# Patient Record
Sex: Female | Born: 1965 | Race: White | Hispanic: No | Marital: Married | Smoking: Never smoker
Health system: Southern US, Community
[De-identification: ages and names within clinical notes are randomized; demographics above are authoritative.]

## PROBLEM LIST (undated history)

## (undated) DIAGNOSIS — K219 Gastro-esophageal reflux disease without esophagitis: Secondary | ICD-10-CM

## (undated) DIAGNOSIS — Z8041 Family history of malignant neoplasm of ovary: Secondary | ICD-10-CM

## (undated) DIAGNOSIS — J3089 Other allergic rhinitis: Secondary | ICD-10-CM

## (undated) DIAGNOSIS — K5900 Constipation, unspecified: Secondary | ICD-10-CM

## (undated) DIAGNOSIS — J45909 Unspecified asthma, uncomplicated: Secondary | ICD-10-CM

## (undated) DIAGNOSIS — K649 Unspecified hemorrhoids: Secondary | ICD-10-CM

## (undated) DIAGNOSIS — Z1371 Encounter for nonprocreative screening for genetic disease carrier status: Secondary | ICD-10-CM

## (undated) DIAGNOSIS — D649 Anemia, unspecified: Secondary | ICD-10-CM

## (undated) DIAGNOSIS — K594 Anal spasm: Secondary | ICD-10-CM

## (undated) DIAGNOSIS — Z803 Family history of malignant neoplasm of breast: Secondary | ICD-10-CM

## (undated) DIAGNOSIS — R110 Nausea: Secondary | ICD-10-CM

## (undated) DIAGNOSIS — Z808 Family history of malignant neoplasm of other organs or systems: Secondary | ICD-10-CM

## (undated) DIAGNOSIS — R1310 Dysphagia, unspecified: Secondary | ICD-10-CM

## (undated) DIAGNOSIS — H409 Unspecified glaucoma: Secondary | ICD-10-CM

## (undated) DIAGNOSIS — R109 Unspecified abdominal pain: Secondary | ICD-10-CM

## (undated) DIAGNOSIS — Z8052 Family history of malignant neoplasm of bladder: Secondary | ICD-10-CM

## (undated) DIAGNOSIS — R197 Diarrhea, unspecified: Secondary | ICD-10-CM

## (undated) HISTORY — DX: Family history of malignant neoplasm of breast: Z80.3

## (undated) HISTORY — PX: NOSE SURGERY: SHX723

## (undated) HISTORY — DX: Family history of malignant neoplasm of ovary: Z80.41

## (undated) HISTORY — DX: Unspecified abdominal pain: R10.9

## (undated) HISTORY — DX: Dysphagia, unspecified: R13.10

## (undated) HISTORY — PX: ABDOMINAL HYSTERECTOMY: SHX81

## (undated) HISTORY — DX: Family history of malignant neoplasm of bladder: Z80.52

## (undated) HISTORY — DX: Unspecified hemorrhoids: K64.9

## (undated) HISTORY — DX: Other allergic rhinitis: J30.89

## (undated) HISTORY — DX: Anal spasm: K59.4

## (undated) HISTORY — PX: TONSILLECTOMY: SUR1361

## (undated) HISTORY — DX: Anemia, unspecified: D64.9

## (undated) HISTORY — DX: Constipation, unspecified: K59.00

## (undated) HISTORY — PX: BREAST LUMPECTOMY: SHX2

## (undated) HISTORY — PX: SHOULDER SURGERY: SHX246

## (undated) HISTORY — PX: BREAST EXCISIONAL BIOPSY: SUR124

## (undated) HISTORY — DX: Unspecified glaucoma: H40.9

## (undated) HISTORY — PX: BACK SURGERY: SHX140

## (undated) HISTORY — DX: Nausea: R11.0

## (undated) HISTORY — PX: APPENDECTOMY: SHX54

## (undated) HISTORY — DX: Family history of malignant neoplasm of other organs or systems: Z80.8

## (undated) HISTORY — PX: WISDOM TOOTH EXTRACTION: SHX21

## (undated) HISTORY — DX: Unspecified asthma, uncomplicated: J45.909

## (undated) HISTORY — DX: Diarrhea, unspecified: R19.7

## (undated) HISTORY — DX: Gastro-esophageal reflux disease without esophagitis: K21.9

---

## 1997-05-12 ENCOUNTER — Inpatient Hospital Stay (HOSPITAL_COMMUNITY): Admission: AD | Admit: 1997-05-12 | Discharge: 1997-05-14 | Payer: Self-pay | Admitting: Obstetrics & Gynecology

## 1997-06-13 ENCOUNTER — Other Ambulatory Visit: Admission: RE | Admit: 1997-06-13 | Discharge: 1997-06-13 | Payer: Self-pay | Admitting: Obstetrics & Gynecology

## 2000-10-01 ENCOUNTER — Ambulatory Visit (HOSPITAL_COMMUNITY): Admission: RE | Admit: 2000-10-01 | Discharge: 2000-10-01 | Payer: Self-pay | Admitting: Orthopedic Surgery

## 2002-07-25 ENCOUNTER — Encounter: Admission: RE | Admit: 2002-07-25 | Discharge: 2002-07-25 | Payer: Self-pay | Admitting: Obstetrics & Gynecology

## 2002-10-14 ENCOUNTER — Inpatient Hospital Stay (HOSPITAL_COMMUNITY): Admission: AD | Admit: 2002-10-14 | Discharge: 2002-10-16 | Payer: Self-pay | Admitting: Obstetrics & Gynecology

## 2002-11-17 ENCOUNTER — Other Ambulatory Visit: Admission: RE | Admit: 2002-11-17 | Discharge: 2002-11-17 | Payer: Self-pay | Admitting: Obstetrics and Gynecology

## 2006-11-17 ENCOUNTER — Ambulatory Visit (HOSPITAL_COMMUNITY): Admission: RE | Admit: 2006-11-17 | Discharge: 2006-11-17 | Payer: Self-pay | Admitting: Obstetrics and Gynecology

## 2006-11-19 ENCOUNTER — Encounter: Admission: RE | Admit: 2006-11-19 | Discharge: 2006-11-19 | Payer: Self-pay | Admitting: Obstetrics and Gynecology

## 2006-12-16 ENCOUNTER — Encounter: Admission: RE | Admit: 2006-12-16 | Discharge: 2006-12-16 | Payer: Self-pay | Admitting: Surgery

## 2006-12-16 ENCOUNTER — Ambulatory Visit (HOSPITAL_BASED_OUTPATIENT_CLINIC_OR_DEPARTMENT_OTHER): Admission: RE | Admit: 2006-12-16 | Discharge: 2006-12-16 | Payer: Self-pay | Admitting: Surgery

## 2006-12-16 ENCOUNTER — Encounter (INDEPENDENT_AMBULATORY_CARE_PROVIDER_SITE_OTHER): Payer: Self-pay | Admitting: Surgery

## 2008-05-07 IMAGING — MG MM BREAST WIRE LOCALIZATION*R*
3 series · 3 of 3 positions shown · non-contrast
Comparison: none

MAMMO BREAST SURGICAL SPECIMEN

BREAST WIRE LOCALIZATION *R*
RIGHT BREAST WIRE LOCALIZATION AND SURGICAL SPECIMEN RADIOGRAPH:
CLINICAL DATA: 41-year-old female with right breast mass for needle localization.

[R LM]
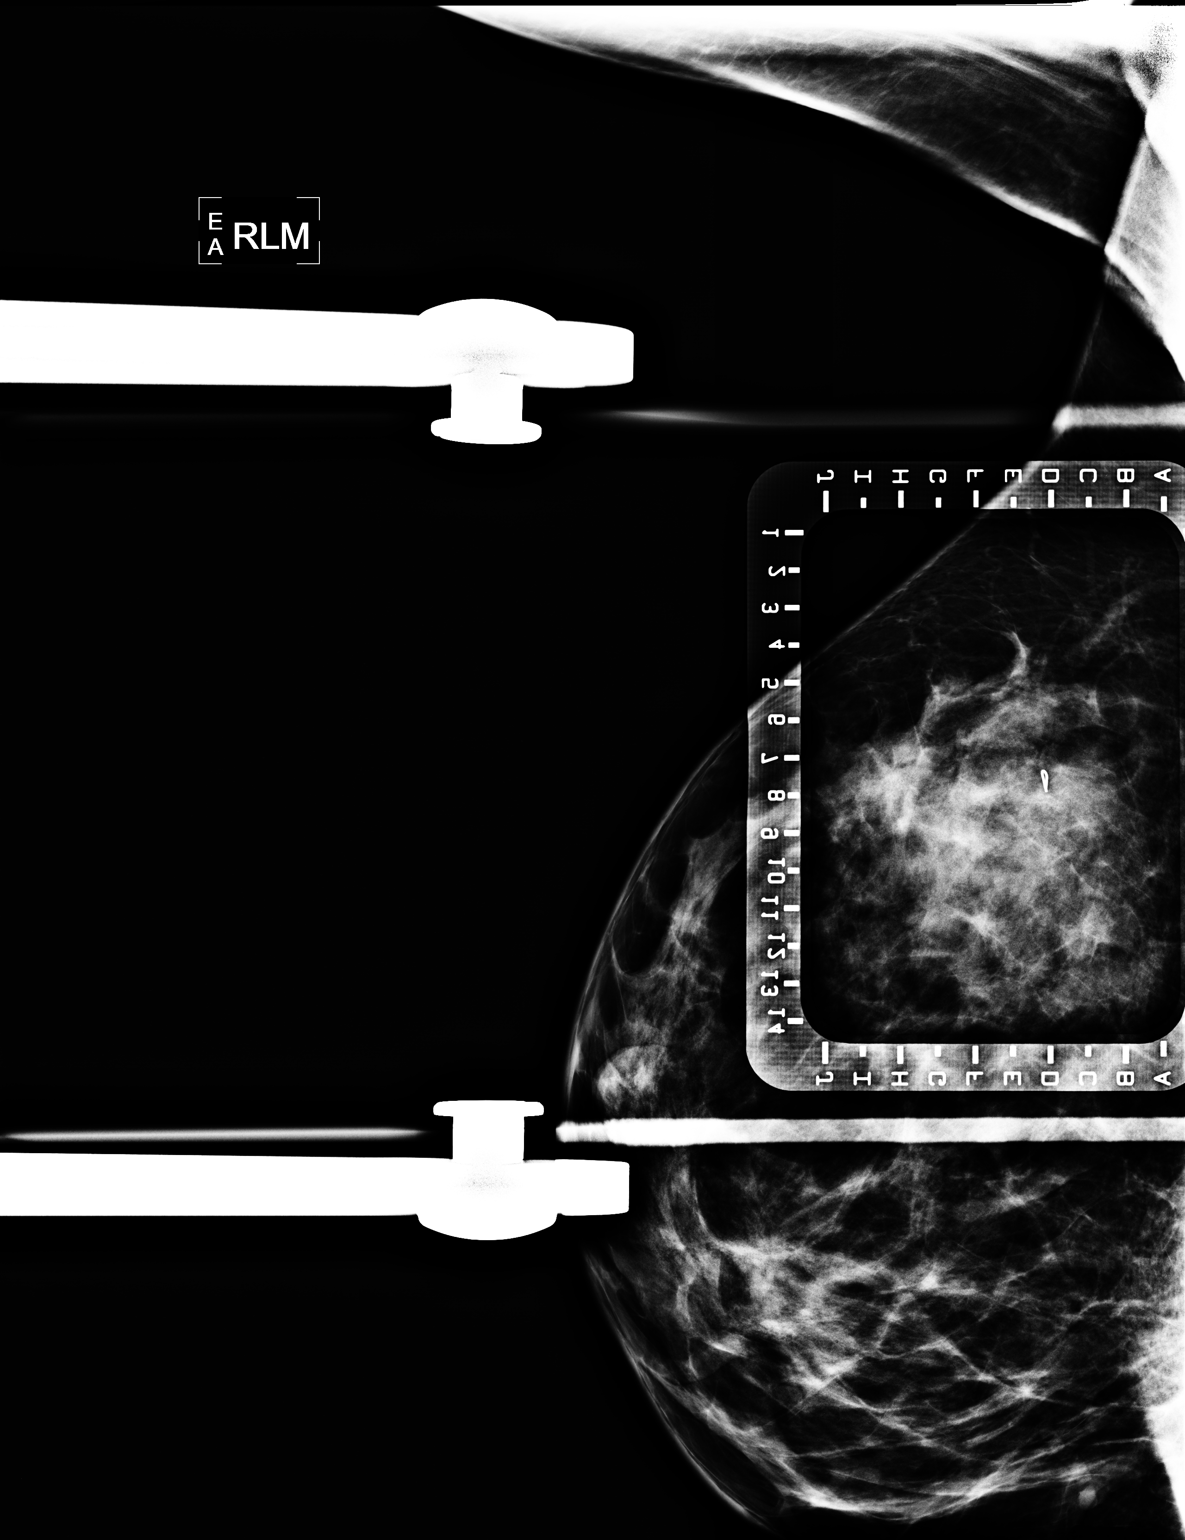

[R CC (1 of 2)]
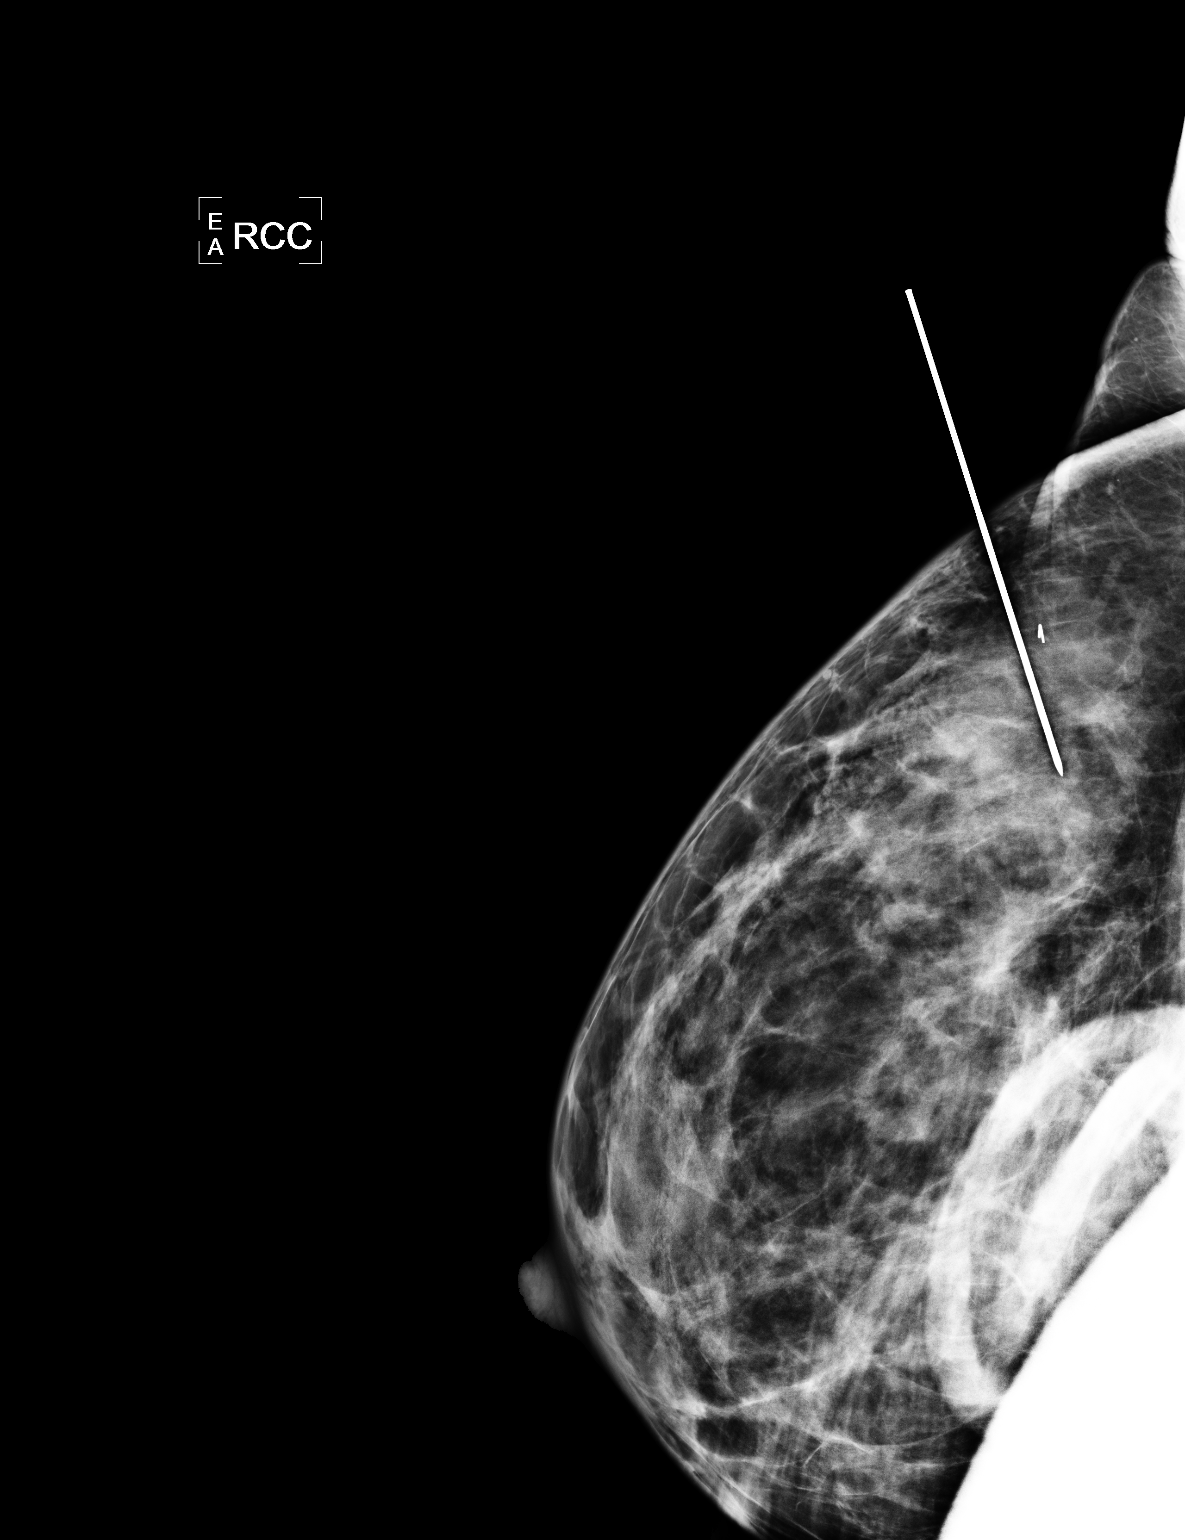

[R CC (2 of 2)]
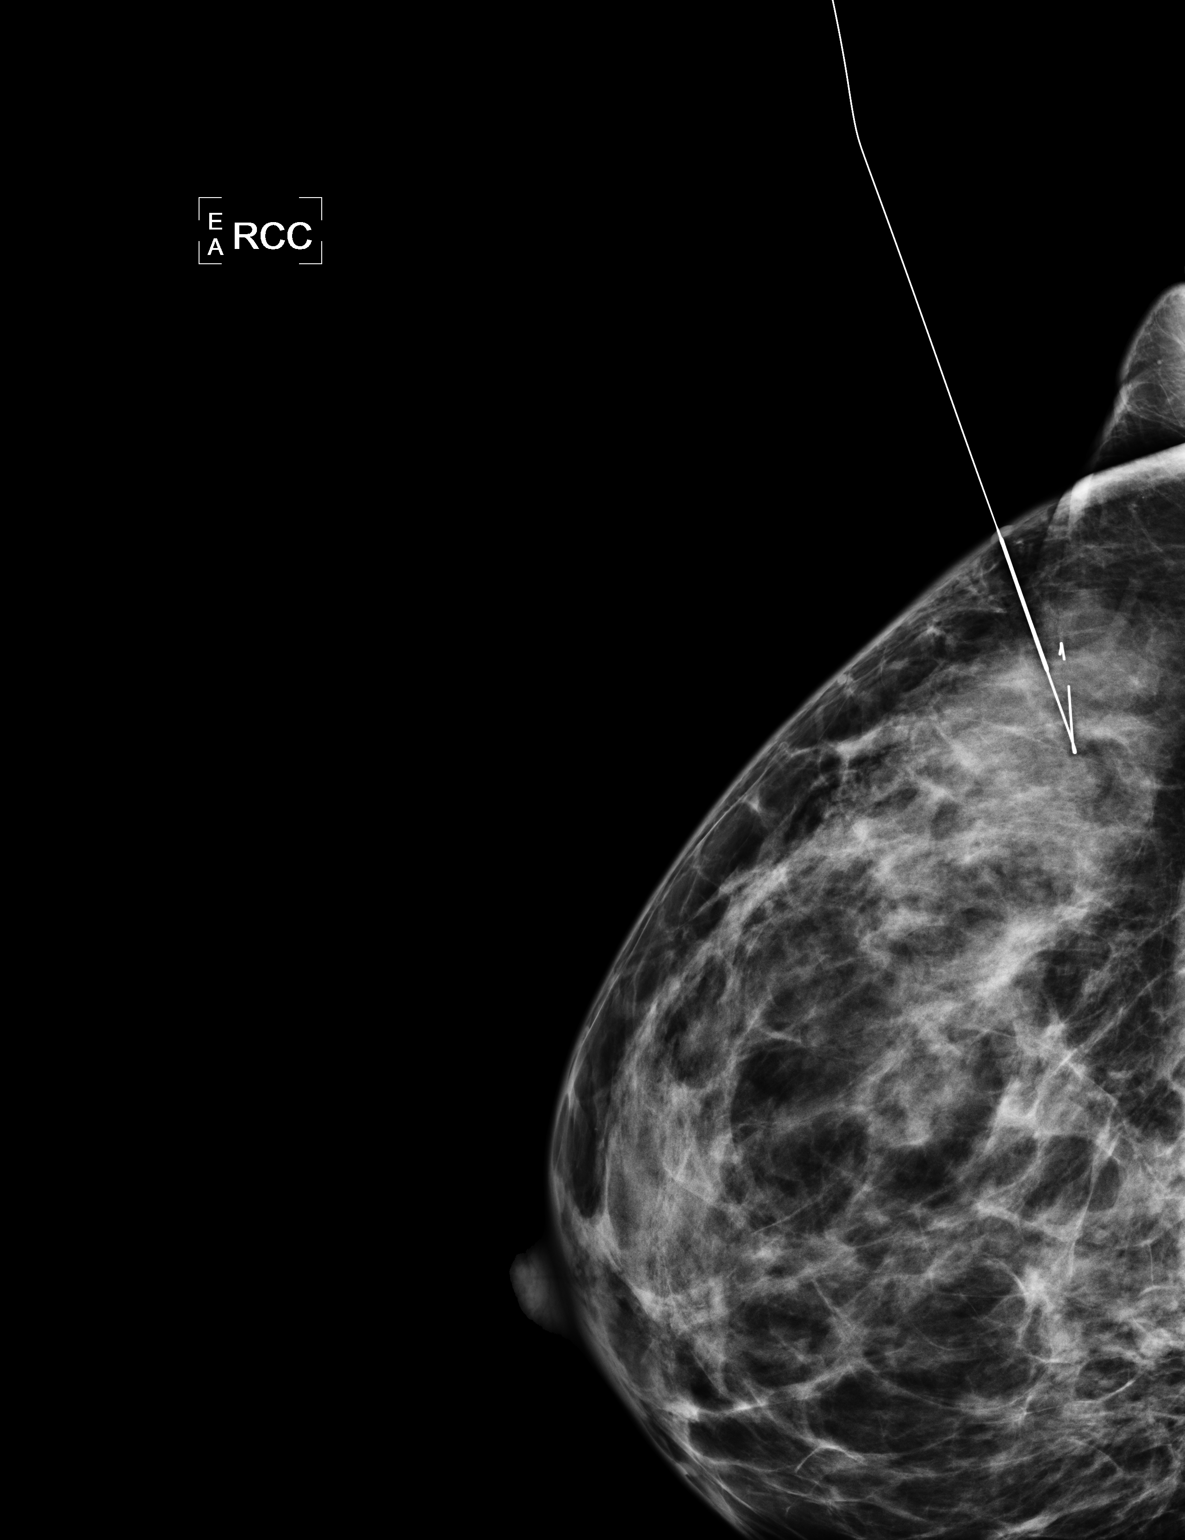

[3 of 3 positions shown; findings below may reference images not displayed]

Written informed consent was obtained and the patient understood the procedure, risks, benefits, 
alternatives, had no questions and agreed to proceed with the procedure.

The patient's lateral right breast was prepped with Betadine and alcohol and anesthetized with 1% 
Lidocaine.  The biopsy clip was targeted. Using a Kopans needle/wire, a lateral approach was 
utilized and wire tip placed 1.5 cm past the biopsy clip.  The clip lies 2 cm from the skin 
surface. The wire was stabilized to the skin and the patient sent to surgery. The enitire procedure
was performed with sterile technique.

Surgical specimen was imaged and demonstrates both the entire wire and clip within the specimen.
IMPRESSION: Right breast wire localization and surgical specimen, as described.

,

## 2008-10-26 ENCOUNTER — Encounter: Admission: RE | Admit: 2008-10-26 | Discharge: 2008-10-26 | Payer: Self-pay | Admitting: Internal Medicine

## 2009-10-30 ENCOUNTER — Encounter: Admission: RE | Admit: 2009-10-30 | Discharge: 2009-10-30 | Payer: Self-pay | Admitting: Orthopedic Surgery

## 2010-05-26 ENCOUNTER — Other Ambulatory Visit: Payer: Self-pay | Admitting: Specialist

## 2010-05-26 ENCOUNTER — Ambulatory Visit (HOSPITAL_COMMUNITY)
Admission: RE | Admit: 2010-05-26 | Discharge: 2010-05-26 | Disposition: A | Discharge status: Home / Self Care | Payer: BC Managed Care – PPO | Source: Ambulatory Visit | Attending: Specialist | Admitting: Specialist

## 2010-05-26 ENCOUNTER — Encounter (HOSPITAL_COMMUNITY): Payer: BC Managed Care – PPO

## 2010-05-26 ENCOUNTER — Other Ambulatory Visit (HOSPITAL_COMMUNITY): Payer: Self-pay | Admitting: Specialist

## 2010-05-26 DIAGNOSIS — Z01818 Encounter for other preprocedural examination: Secondary | ICD-10-CM

## 2010-05-26 DIAGNOSIS — M25559 Pain in unspecified hip: Secondary | ICD-10-CM | POA: Insufficient documentation

## 2010-05-26 DIAGNOSIS — M545 Low back pain, unspecified: Secondary | ICD-10-CM | POA: Insufficient documentation

## 2010-05-26 DIAGNOSIS — Z01810 Encounter for preprocedural cardiovascular examination: Secondary | ICD-10-CM | POA: Insufficient documentation

## 2010-05-26 DIAGNOSIS — Z01812 Encounter for preprocedural laboratory examination: Secondary | ICD-10-CM | POA: Insufficient documentation

## 2010-05-26 DIAGNOSIS — Z79899 Other long term (current) drug therapy: Secondary | ICD-10-CM | POA: Insufficient documentation

## 2010-05-26 DIAGNOSIS — M79609 Pain in unspecified limb: Secondary | ICD-10-CM | POA: Insufficient documentation

## 2010-05-26 DIAGNOSIS — M48061 Spinal stenosis, lumbar region without neurogenic claudication: Secondary | ICD-10-CM | POA: Insufficient documentation

## 2010-05-26 DIAGNOSIS — M5126 Other intervertebral disc displacement, lumbar region: Secondary | ICD-10-CM | POA: Insufficient documentation

## 2010-05-26 LAB — COMPREHENSIVE METABOLIC PANEL
ALT: 24 U/L (ref 0–35)
Albumin: 4.4 g/dL (ref 3.5–5.2)
Alkaline Phosphatase: 64 U/L (ref 39–117)
BUN: 11 mg/dL (ref 6–23)
Chloride: 104 mEq/L (ref 96–112)
Glucose, Bld: 85 mg/dL (ref 70–99)
Potassium: 3.9 mEq/L (ref 3.5–5.1)
Total Bilirubin: 0.3 mg/dL (ref 0.3–1.2)

## 2010-05-26 LAB — URINALYSIS, ROUTINE W REFLEX MICROSCOPIC
Ketones, ur: NEGATIVE mg/dL
Protein, ur: NEGATIVE mg/dL
Urobilinogen, UA: 0.2 mg/dL (ref 0.0–1.0)

## 2010-05-26 LAB — CBC
MCH: 29.2 pg (ref 26.0–34.0)
MCV: 89.5 fL (ref 78.0–100.0)
Platelets: 254 10*3/uL (ref 150–400)
RDW: 13.3 % (ref 11.5–15.5)

## 2010-05-26 LAB — URINE MICROSCOPIC-ADD ON

## 2010-05-27 NOTE — Op Note (Signed)
NAME:  Meredith Blackwell, KARY                  ACCOUNT NO.:  [id number]   MEDICAL RECORD NO.:  [id number]          PATIENT TYPE:  AMB   LOCATION:  DSC                          FACILITY:  [facility]   PHYSICIAN:  Lainez A. Cornett, M.D.DATE OF BIRTH:  Sep 18, 1965   DATE OF PROCEDURE:  12/16/2006  DATE OF DISCHARGE:                               OPERATIVE REPORT   PREOPERATIVE DIAGNOSIS:  Right breast mass.   POSTOPERATIVE DIAGNOSIS:  Right breast mass.   PROCEDURE:  Right breast needle localized excisional biopsy.   SURGEON:  Maisie Fus A. Cornett, M.D.   ASSISTANT:  None.   ESTIMATED BLOOD LOSS:  Was 20 mL.   ANESTHESIA:  LMA with 0.25% Sensorcaine with epinephrine.   SPECIMENS:  Right breast mass with localizing needle and wire from  previous biopsy all found to be present in the specimen radiograph plus  additional superior margin.   INDICATIONS FOR PROCEDURE:  The patient is a 45 year old female found to  have a mass on her right breast.  This was biopsied and had some  atypical features of a fibroadenoma and excisional biopsy recommended to  exclude a phyllodes tumor of the right breast.  She presents today for  that.   DESCRIPTION OF PROCEDURE:  The patient was brought to the operating  room.  She had a previous right breast needle localization by the  radiologist.  After induction of LMA anesthesia, right breast was  prepped and draped in sterile fashion.  The wire exited the right upper  outer quadrant.  Local anesthesia was infiltrated and a curvilinear  incision was made around the wire.  The wire was pulled out of the  incision and the tissue around the tip was excised with the wire intact.  Radiograph revealed this to be intact.  A small clip was noted within  the specimen as well as the tip of the wire.  Irrigation was used and  suctioned out.  Cautery was used for hemostasis.  The lumpectomy cavity,  after irrigation, was closed in layers with a 3-0 Vicryl for the deep  layer and  a 4-0 Monocryl  subcuticular stitch.  Dermabond was applied to the incision.  All final  counts, sponge, needle and instruments, were found to be correct for  this portion of the case.  The patient was then awoken and taken to  recovery in satisfactory condition.      Maloof A. Cornett, M.D.  Electronically Signed     TAC/MEDQ  D:  12/16/2006  T:  12/16/2006  Job:  [id number]   cc:   Loraine Leriche A. Perini, M.D.

## 2010-05-28 ENCOUNTER — Ambulatory Visit (HOSPITAL_COMMUNITY)
Admission: RE | Admit: 2010-05-28 | Discharge: 2010-05-30 | Disposition: A | Discharge status: Home / Self Care | Payer: BC Managed Care – PPO | Source: Ambulatory Visit | Attending: Specialist | Admitting: Specialist

## 2010-05-28 ENCOUNTER — Ambulatory Visit (HOSPITAL_COMMUNITY): Payer: BC Managed Care – PPO

## 2010-05-28 ENCOUNTER — Other Ambulatory Visit: Payer: Self-pay | Admitting: Specialist

## 2010-05-28 DIAGNOSIS — M549 Dorsalgia, unspecified: Secondary | ICD-10-CM | POA: Insufficient documentation

## 2010-05-28 DIAGNOSIS — Z79899 Other long term (current) drug therapy: Secondary | ICD-10-CM | POA: Insufficient documentation

## 2010-05-28 DIAGNOSIS — M5126 Other intervertebral disc displacement, lumbar region: Secondary | ICD-10-CM | POA: Insufficient documentation

## 2010-05-28 DIAGNOSIS — M48061 Spinal stenosis, lumbar region without neurogenic claudication: Secondary | ICD-10-CM | POA: Insufficient documentation

## 2010-05-30 NOTE — Discharge Summary (Signed)
   NAME:  Meredith Blackwell, Meredith Blackwell NO.:  [id number]   MEDICAL RECORD NO.:  [id number]                   PATIENT TYPE:  INP   LOCATION:  9103                                 FACILITY:  [facility]   PHYSICIAN:  Gerrit Friends. Aldona Bar, M.D.                DATE OF BIRTH:  16-Dec-1965   DATE OF ADMISSION:  10/14/2002  DATE OF DISCHARGE:                                 DISCHARGE SUMMARY   DISCHARGE DIAGNOSES:  1. Term pregnancy, delivered, 8-pound 13-ounce female infant, Apgars 9 and 9.  2. Positive group B strep antenatally.  3. Blood type O positive.   PROCEDURES:  1. Intrapartum penicillin.  2. Normal spontaneous delivery.  3. First degree tear and repair.   SUMMARY:  This 45 year old gravida 4 para 3 was admitted at term in early  labor.  She was given intrapartum antibiotics because of positive group B  strep culture.  She had good progression of her labor with a subsequent  normal spontaneous delivery of a viable 8-pound 13-ounce female infant with  Apgars of 9 and 9 over a first degree tear and there was a small tear to the  left of the urethra, both of which were repaired without difficulty.  Her  postpartum course was benign.  On the morning of October 16, 2002 she was  ambulating well, tolerating regular diet well, having normal bowel and  bladder function, was afebrile, was breast feeding without difficulty, and  was desirous of discharge.  Her discharge hemoglobin was 11.3 with a white  count of 10,400 and a platelet count of 143,000.  She will return to the  office for follow-up in approximately four weeks time.   CONDITION ON DISCHARGE:  Improved.   MEDICATIONS AT THE TIME OF DISCHARGE:  1. Vitamins one a day.  2. Motrin 600 mg q.6h. as needed for pain.  3. Tylox one to two q.4-6h. as needed for severe pain.                                               Gerrit Friends. Aldona Bar, M.D.    RMW/MEDQ  D:  10/16/2002  T:  10/16/2002  Job:  [id number]

## 2010-06-01 NOTE — Op Note (Signed)
NAME:  Meredith Blackwell, Meredith Blackwell                  ACCOUNT NO.:  [id number]  MEDICAL RECORD NO.:  [id number]           PATIENT TYPE:  O  LOCATION:  DAYL                         FACILITY:  [facility]  PHYSICIAN:  Jene Every, M.D.    DATE OF BIRTH:  1965-02-17  DATE OF PROCEDURE: DATE OF DISCHARGE:                              OPERATIVE REPORT   PREOPERATIVE DIAGNOSIS:  Spinal stenosis, herniated nucleus pulposus at L4-L5 with free fragment.  POSTOPERATIVE DIAGNOSIS:  Spinal stenosis, herniated nucleus pulposus at L4-L5 with free fragment.  PROCEDURES PERFORMED: 1. Hemilaminotomy and decompression at L4-L5 with foraminotomies of L4     and L5 2. Retrieval of multiple free fragments. 3. Microdiskectomy, L4-L5.  ANESTHESIA:  General.  ASSISTANT:  Roma Schanz, P.A.  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  Risks and benefits discussed including bleeding, infection, damage to neurovascular structures, no change in symptoms, worsening symptoms, need for repeat debridement, DVT, PE, anesthetic complications, etc.  TECHNIQUE:  Patient in supine position, after induction of adequate anesthesia and 1 g of Kefzol and clindamycin, she was placed prone on the [place] frame.  All bony prominences well padded.  Lumbar region was prepped and draped in usual sterile fashion.  An 18-gauge spinal needle utilized to localize L4-L5 interspace, confirmed with x-ray.  Incision was made from spinous process L4 to L5.  Subcutaneous tissue dissected. Electrocautery was utilized to achieve hemostasis.  Dorsolumbar fascia identified and divided in line with the skin incision.  Paraspinous muscle elevated from lamina of L4 and L5.  Fragment had migrated to just beneath the pedicle at L4.  We therefore gained our exposure cephalad preserving the pars.  We performed  hemilaminotomy on the caudad edge of L4 utilizing 3 mm Kerrison to a point just to gain access to the L4 foramen.  Ligamentum flavum detached from cephalad edge of L5 utilizing straight curette and foraminotomy of L5 was performed.  Ligamentum flavum then removed from the interspace.  A hypertrophic vascular release was noted tethering the disk space and extending cephalad.  We cauterized and divided the epidural venous plexus.  Hockey-stick probe passed cephalad, felt the fragment.  With the L4 root and the pedicle identified and the L4 root protected with a hockey stick, we made multiple sweeps with a neural probe.  No fragment initially was retrieved.  We made an incision in the annulotomy and removed herniated disk material from the disk space and the subannular space, which was a small fragment that migrated beneath the longitudinal ligament.  This was retrieved.  We removed all herniated material from the central disk. We continued exploration slightly cephalad beneath the axilla of the L4 root out into the L4 foramen and beneath the thecal sac.  Large fragment was noted beneath the thecal sac and retrieved with a micropituitary.  A second fragment was also noted near the axilla of the L4 root, retrieved with a micropituitary as well.  Checking all of our areas, we found a final large fragment that extended into the  foramen compressing the L4 root.  This was retrieved with a nerve hook and a micropituitary.  All 3 of these were sent to pathology for appropriate specimen.  Following this, there was good mobility of the L4 and L5 roots.  Hockey stick probe passed freely up the foramen of L4 and L5 beneath the thecal sac. We used thrombin-soaked Gelfoam and electrocautery to achieve hemostasis.  We copiously irrigated the disk space with antibiotic irrigation.  Inspection following this revealed no evidence of CSF leak or active bleeding.  We had a Penfield 4 just cephalad to the disk  space in the area of the free fragment, which was confirmed by x-ray.  Next, we removed McCullough retractor.  Paraspinous muscles inspected.  No evidence of active bleeding.  We repaired the dorsolumbar fascia with #1 Vicryl interrupted figure-of-8 sutures, subcu with 2-0 Vicryl simple sutures.  Skin was reapproximated with 4-0 subcuticular Prolene.  Wound was reinforced with Steri-Strips.  Sterile dressing applied.  Placed supine on hospital bed, extubated without difficulty and transported to recovery room in satisfactory condition.  The patient tolerated the procedure well.  No complications.  Blood loss was 10 cc.  Just prior to closure, again we examined the thecal sac.  No evidence of active bleeding or CSF leakage and at least 1 cm of excursion of the fragment beneath the pedicle without difficulty.     Jene Every, M.D.     Cordelia Pen  D:  05/28/2010  T:  05/28/2010  Job:  [id number]  Electronically Signed by Jene Every M.D. on 06/01/2010 01:31:14 PM

## 2010-10-20 LAB — DIFFERENTIAL
Basophils Absolute: 0
Eosinophils Absolute: 0.1 — ABNORMAL LOW
Eosinophils Relative: 1
Lymphs Abs: 1.4

## 2010-10-20 LAB — BASIC METABOLIC PANEL
BUN: 8
CO2: 25
Calcium: 9.1
Chloride: 105
Creatinine, Ser: 0.69
GFR calc Af Amer: >60
GFR calc non Af Amer: >60
Glucose, Bld: 85
Potassium: 3.8
Sodium: 137

## 2010-10-20 LAB — CBC
HCT: 37.4
MCV: 87.8
Platelets: 229
RDW: 12.9

## 2012-10-05 ENCOUNTER — Other Ambulatory Visit: Payer: Self-pay

## 2012-10-05 DIAGNOSIS — Z1231 Encounter for screening mammogram for malignant neoplasm of breast: Secondary | ICD-10-CM

## 2012-11-02 ENCOUNTER — Ambulatory Visit
Admission: RE | Admit: 2012-11-02 | Discharge: 2012-11-02 | Disposition: A | Discharge status: Home / Self Care | Payer: BC Managed Care – PPO | Source: Ambulatory Visit

## 2012-11-02 DIAGNOSIS — Z1231 Encounter for screening mammogram for malignant neoplasm of breast: Secondary | ICD-10-CM

## 2013-10-24 ENCOUNTER — Other Ambulatory Visit: Payer: Self-pay

## 2013-10-24 DIAGNOSIS — Z1231 Encounter for screening mammogram for malignant neoplasm of breast: Secondary | ICD-10-CM

## 2013-11-03 ENCOUNTER — Ambulatory Visit
Admission: RE | Admit: 2013-11-03 | Discharge: 2013-11-03 | Disposition: A | Discharge status: Home / Self Care | Payer: BC Managed Care – PPO | Source: Ambulatory Visit

## 2013-11-03 DIAGNOSIS — Z1231 Encounter for screening mammogram for malignant neoplasm of breast: Secondary | ICD-10-CM

## 2014-10-02 ENCOUNTER — Other Ambulatory Visit: Payer: Self-pay

## 2014-10-02 DIAGNOSIS — Z1231 Encounter for screening mammogram for malignant neoplasm of breast: Secondary | ICD-10-CM

## 2014-11-14 ENCOUNTER — Ambulatory Visit
Admission: RE | Admit: 2014-11-14 | Discharge: 2014-11-14 | Disposition: A | Discharge status: Home / Self Care | Payer: BLUE CROSS/BLUE SHIELD | Source: Ambulatory Visit

## 2014-11-14 DIAGNOSIS — Z1231 Encounter for screening mammogram for malignant neoplasm of breast: Secondary | ICD-10-CM

## 2014-11-16 ENCOUNTER — Other Ambulatory Visit: Payer: Self-pay | Admitting: Obstetrics & Gynecology

## 2014-11-16 DIAGNOSIS — R928 Other abnormal and inconclusive findings on diagnostic imaging of breast: Secondary | ICD-10-CM

## 2014-11-23 ENCOUNTER — Ambulatory Visit
Admission: RE | Admit: 2014-11-23 | Discharge: 2014-11-23 | Disposition: A | Discharge status: Home / Self Care | Payer: BLUE CROSS/BLUE SHIELD | Source: Ambulatory Visit | Attending: Obstetrics & Gynecology | Admitting: Obstetrics & Gynecology

## 2014-11-23 ENCOUNTER — Other Ambulatory Visit: Payer: Self-pay | Admitting: Obstetrics & Gynecology

## 2014-11-23 DIAGNOSIS — R928 Other abnormal and inconclusive findings on diagnostic imaging of breast: Secondary | ICD-10-CM

## 2014-11-23 DIAGNOSIS — N6310 Unspecified lump in the right breast, unspecified quadrant: Secondary | ICD-10-CM

## 2015-04-15 DIAGNOSIS — H4322 Crystalline deposits in vitreous body, left eye: Secondary | ICD-10-CM | POA: Diagnosis not present

## 2015-04-15 DIAGNOSIS — H35412 Lattice degeneration of retina, left eye: Secondary | ICD-10-CM | POA: Diagnosis not present

## 2015-04-15 DIAGNOSIS — H33302 Unspecified retinal break, left eye: Secondary | ICD-10-CM | POA: Diagnosis not present

## 2015-04-15 DIAGNOSIS — H43392 Other vitreous opacities, left eye: Secondary | ICD-10-CM | POA: Diagnosis not present

## 2015-06-26 DIAGNOSIS — Z23 Encounter for immunization: Secondary | ICD-10-CM | POA: Diagnosis not present

## 2015-08-01 DIAGNOSIS — D259 Leiomyoma of uterus, unspecified: Secondary | ICD-10-CM | POA: Diagnosis not present

## 2015-08-01 DIAGNOSIS — N946 Dysmenorrhea, unspecified: Secondary | ICD-10-CM | POA: Diagnosis not present

## 2015-08-29 ENCOUNTER — Other Ambulatory Visit: Payer: Self-pay | Admitting: Surgery

## 2015-08-29 DIAGNOSIS — N63 Unspecified lump in breast: Secondary | ICD-10-CM | POA: Diagnosis not present

## 2015-10-04 DIAGNOSIS — N924 Excessive bleeding in the premenopausal period: Secondary | ICD-10-CM | POA: Diagnosis not present

## 2015-10-04 DIAGNOSIS — D259 Leiomyoma of uterus, unspecified: Secondary | ICD-10-CM | POA: Diagnosis not present

## 2015-10-09 ENCOUNTER — Other Ambulatory Visit: Payer: Self-pay | Admitting: Obstetrics and Gynecology

## 2015-10-09 DIAGNOSIS — D241 Benign neoplasm of right breast: Secondary | ICD-10-CM | POA: Diagnosis not present

## 2015-10-09 DIAGNOSIS — D259 Leiomyoma of uterus, unspecified: Secondary | ICD-10-CM | POA: Diagnosis not present

## 2015-10-09 DIAGNOSIS — N8301 Follicular cyst of right ovary: Secondary | ICD-10-CM | POA: Diagnosis not present

## 2015-10-09 DIAGNOSIS — N920 Excessive and frequent menstruation with regular cycle: Secondary | ICD-10-CM | POA: Diagnosis not present

## 2015-10-09 DIAGNOSIS — N800 Endometriosis of uterus: Secondary | ICD-10-CM | POA: Diagnosis not present

## 2015-10-09 DIAGNOSIS — N946 Dysmenorrhea, unspecified: Secondary | ICD-10-CM | POA: Diagnosis not present

## 2015-10-09 DIAGNOSIS — N6081 Other benign mammary dysplasias of right breast: Secondary | ICD-10-CM | POA: Diagnosis not present

## 2015-10-09 DIAGNOSIS — N8302 Follicular cyst of left ovary: Secondary | ICD-10-CM | POA: Diagnosis not present

## 2015-10-09 DIAGNOSIS — N63 Unspecified lump in breast: Secondary | ICD-10-CM | POA: Diagnosis not present

## 2015-10-09 DIAGNOSIS — N838 Other noninflammatory disorders of ovary, fallopian tube and broad ligament: Secondary | ICD-10-CM | POA: Diagnosis not present

## 2015-10-25 DIAGNOSIS — R350 Frequency of micturition: Secondary | ICD-10-CM | POA: Diagnosis not present

## 2015-10-25 DIAGNOSIS — D509 Iron deficiency anemia, unspecified: Secondary | ICD-10-CM | POA: Diagnosis not present

## 2015-11-15 DIAGNOSIS — D509 Iron deficiency anemia, unspecified: Secondary | ICD-10-CM | POA: Diagnosis not present

## 2015-11-20 ENCOUNTER — Other Ambulatory Visit: Payer: Self-pay | Admitting: Internal Medicine

## 2015-11-20 DIAGNOSIS — Z1231 Encounter for screening mammogram for malignant neoplasm of breast: Secondary | ICD-10-CM

## 2015-12-24 ENCOUNTER — Encounter: Payer: Self-pay | Admitting: *Deleted

## 2015-12-24 ENCOUNTER — Encounter: Payer: BLUE CROSS/BLUE SHIELD | Attending: Internal Medicine | Admitting: *Deleted

## 2015-12-24 DIAGNOSIS — E639 Nutritional deficiency, unspecified: Secondary | ICD-10-CM

## 2015-12-24 DIAGNOSIS — Z713 Dietary counseling and surveillance: Secondary | ICD-10-CM | POA: Diagnosis not present

## 2015-12-24 DIAGNOSIS — R7301 Impaired fasting glucose: Secondary | ICD-10-CM | POA: Diagnosis not present

## 2015-12-24 NOTE — Progress Notes (Signed)
  Medical Nutrition Therapy:  Appt start time: 0900 end time:  1000.   Assessment:  Primary concerns today: Meredith Blackwell is here for nutrition counseling pertaining to request for guidance.  I am working with her daughter for recovery of an eating disorder.  Meredith Blackwell states she has gained some weight during Meredith Blackwell's recovery process and wants to know how to lose weight without triggering her daughter?   States she has a family history of diabetes.  She had GDM 3 times.  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  States her mom and siblings are all heavy (central adiposity).  States she has had elevated glucose numbers in the past.    Knows she has had disordered eating in the past.  She realizes this was not healthy and does not want to be the same size she was or diet the way she used to or exercise the way she used to Maintained 140 without behaviors, but then had multiple surgeries over the years and could move.  Does not exercise much anymore.  Used to run, but doesn't any more.  She is very busy.    Is not able to eat first thing in the morning.  Has to eat a little later or else she gets GI distress follows gluten free diet.  Further exploration reveals she often does not eat breakfast or eat enough and so she is starving by afternoon and often overeats refined carbohydrate/energy-dense foods.  She admits to emotional eating/overeating.  She doesn't feel guilty, but does worry about the health consequences.  She knows she does not prioritize her own needs and is too busy taking care of her family.    Office notes from PCP indicate hyperlipidemia and elevated fasting glucose.  Labs not available at this time.  Preferred Learning Style:   No preference indicated   Learning Readiness:  Ready   MEDICATIONS: see list   DIETARY INTAKE:  Usual eating  pattern includes 2-3 meals and 0-3 snacks per day.  Avoided foods include gluten.    24-hr recall:  B ( AM): coffee  Snk ( AM): none  L ( PM): nachos with cheese and ground beef Snk ( PM): handful peanut m&ms D ( PM):  Count grilled nuggets, unsweet tea Snk ( PM): red wine Beverages: water, adequate  Usual physical activity: ADLs     Nutritional Diagnosis:  NB-1.5 Disordered eating pattern As related to meal skipping and emotional eating.  As evidenced by dietary recall.    Intervention:  Nutrition counseling provided.  Reviewed HAES principles.  Focus is on improving her glucose tolerance (delaying diabetes) and normalizing her lipids.  Weight loss will not necessarily help that.  discussed physical activity and that any movement is better than no movement.  Suggested personal trainer that works with her schedule.   Her erratic eating schedule is not conducive to health or well-being.  She does not make time for herself and her eating is suffering and her metabolic health is suffering.  Empowered her to take better care of herself and eat 3 balanced meals/day to minimize the emotional eating/overeating in the afternoon.   Teaching Method Utilized:  Auditory   Barriers to learning/adherence to lifestyle change: prioritizing self-care  Demonstrated degree of understanding via:  Teach Back   Monitoring/Evaluation:  Dietary intake, exercise, labs, and body weight prn.

## 2015-12-24 NOTE — Patient Instructions (Signed)
Meredith Blackwell, Physiological scientist [phone].  [email or web address]  Any movement is better than no movement for your glucose and heart.  Is there a place in your day where you cold walk for 10 minutes?

## 2016-01-10 ENCOUNTER — Other Ambulatory Visit: Payer: Self-pay | Admitting: Internal Medicine

## 2016-01-10 ENCOUNTER — Ambulatory Visit
Admission: RE | Admit: 2016-01-10 | Discharge: 2016-01-10 | Disposition: A | Discharge status: Home / Self Care | Payer: BLUE CROSS/BLUE SHIELD | Source: Ambulatory Visit | Attending: Internal Medicine | Admitting: Internal Medicine

## 2016-01-10 DIAGNOSIS — Z1231 Encounter for screening mammogram for malignant neoplasm of breast: Secondary | ICD-10-CM

## 2016-01-10 DIAGNOSIS — N644 Mastodynia: Secondary | ICD-10-CM | POA: Diagnosis not present

## 2016-01-10 DIAGNOSIS — R928 Other abnormal and inconclusive findings on diagnostic imaging of breast: Secondary | ICD-10-CM | POA: Diagnosis not present

## 2016-02-25 DIAGNOSIS — E784 Other hyperlipidemia: Secondary | ICD-10-CM | POA: Diagnosis not present

## 2016-02-25 DIAGNOSIS — R7301 Impaired fasting glucose: Secondary | ICD-10-CM | POA: Diagnosis not present

## 2016-02-25 DIAGNOSIS — D7589 Other specified diseases of blood and blood-forming organs: Secondary | ICD-10-CM | POA: Diagnosis not present

## 2016-02-25 DIAGNOSIS — Z0000 Encounter for general adult medical examination without abnormal findings: Secondary | ICD-10-CM | POA: Diagnosis not present

## 2016-02-26 DIAGNOSIS — H40033 Anatomical narrow angle, bilateral: Secondary | ICD-10-CM | POA: Diagnosis not present

## 2016-03-02 ENCOUNTER — Encounter: Payer: Self-pay | Admitting: Gastroenterology

## 2016-03-02 DIAGNOSIS — H4089 Other specified glaucoma: Secondary | ICD-10-CM | POA: Diagnosis not present

## 2016-03-02 DIAGNOSIS — Z0000 Encounter for general adult medical examination without abnormal findings: Secondary | ICD-10-CM | POA: Diagnosis not present

## 2016-03-02 DIAGNOSIS — R51 Headache: Secondary | ICD-10-CM | POA: Diagnosis not present

## 2016-03-02 DIAGNOSIS — E784 Other hyperlipidemia: Secondary | ICD-10-CM | POA: Diagnosis not present

## 2016-03-02 DIAGNOSIS — H9313 Tinnitus, bilateral: Secondary | ICD-10-CM | POA: Diagnosis not present

## 2016-03-02 DIAGNOSIS — Z1389 Encounter for screening for other disorder: Secondary | ICD-10-CM | POA: Diagnosis not present

## 2016-03-02 DIAGNOSIS — Z23 Encounter for immunization: Secondary | ICD-10-CM | POA: Diagnosis not present

## 2016-03-09 ENCOUNTER — Ambulatory Visit (AMBULATORY_SURGERY_CENTER): Payer: Self-pay

## 2016-03-09 VITALS — BP 0/0 | Ht 67.0 in | Wt 167.0 lb

## 2016-03-09 DIAGNOSIS — Z1211 Encounter for screening for malignant neoplasm of colon: Secondary | ICD-10-CM

## 2016-03-09 MED ORDER — NA SULFATE-K SULFATE-MG SULF 17.5-3.13-1.6 GM/177ML PO SOLN: 1.0000 | Freq: Once | ORAL | 0 refills | Status: AC

## 2016-03-09 NOTE — Progress Notes (Signed)
Denies allergies to eggs or soy products. Denies complication of anesthesia or sedation. Denies use of weight loss medication. Denies use of O2.   Patient declined emmi.

## 2016-03-11 ENCOUNTER — Encounter: Payer: Self-pay | Admitting: Gastroenterology

## 2016-03-11 DIAGNOSIS — H40033 Anatomical narrow angle, bilateral: Secondary | ICD-10-CM | POA: Diagnosis not present

## 2016-03-20 ENCOUNTER — Ambulatory Visit (AMBULATORY_SURGERY_CENTER): Payer: BLUE CROSS/BLUE SHIELD | Admitting: Gastroenterology

## 2016-03-20 ENCOUNTER — Encounter: Payer: Self-pay | Admitting: Gastroenterology

## 2016-03-20 VITALS — BP 119/79 | HR 83 | Temp 98.9°F | Resp 16 | Ht 67.5 in | Wt 165.0 lb

## 2016-03-20 DIAGNOSIS — Z1211 Encounter for screening for malignant neoplasm of colon: Secondary | ICD-10-CM | POA: Diagnosis not present

## 2016-03-20 DIAGNOSIS — D120 Benign neoplasm of cecum: Secondary | ICD-10-CM

## 2016-03-20 DIAGNOSIS — Z1212 Encounter for screening for malignant neoplasm of rectum: Secondary | ICD-10-CM

## 2016-03-20 DIAGNOSIS — K635 Polyp of colon: Secondary | ICD-10-CM | POA: Diagnosis not present

## 2016-03-20 DIAGNOSIS — D122 Benign neoplasm of ascending colon: Secondary | ICD-10-CM

## 2016-03-20 MED ORDER — SODIUM CHLORIDE 0.9 % IV SOLN: 500.0000 mL | INTRAVENOUS | Status: DC

## 2016-03-20 NOTE — Progress Notes (Signed)
To PACU VSS report to RN pt alert

## 2016-03-20 NOTE — Progress Notes (Signed)
Called to room to assist during endoscopic procedure.  Patient ID and intended procedure confirmed with present staff. Received instructions for my participation in the procedure from the performing physician.  

## 2016-03-20 NOTE — Progress Notes (Signed)
Pt's states no medical or surgical changes since previsit or office visit. 

## 2016-03-20 NOTE — Patient Instructions (Signed)
**  Handouts given on polyps and hemorrhoids**   YOU HAD AN ENDOSCOPIC PROCEDURE TODAY: Refer to the procedure report and other information in the discharge instructions given to you for any specific questions about what was found during the examination. If this information does not answer your questions, please call [place] office at [phone] to clarify.   YOU SHOULD EXPECT: Some feelings of bloating in the abdomen. Passage of more gas than usual. Walking can help get rid of the air that was put into your GI tract during the procedure and reduce the bloating. If you had a lower endoscopy (such as a colonoscopy or flexible sigmoidoscopy) you may notice spotting of blood in your stool or on the toilet paper. Some abdominal soreness may be present for a day or two, also.  DIET: Your first meal following the procedure should be a light meal and then it is ok to progress to your normal diet. A half-sandwich or bowl of soup is an example of a good first meal. Heavy or fried foods are harder to digest and may make you feel nauseous or bloated. Drink plenty of fluids but you should avoid alcoholic beverages for 24 hours. If you had a esophageal dilation, please see attached instructions for diet.    ACTIVITY: Your care partner should take you home directly after the procedure. You should plan to take it easy, moving slowly for the rest of the day. You can resume normal activity the day after the procedure however YOU SHOULD NOT DRIVE, use power tools, machinery or perform tasks that involve climbing or major physical exertion for 24 hours (because of the sedation medicines used during the test).   SYMPTOMS TO REPORT IMMEDIATELY: A gastroenterologist can be reached at any hour. Please call [phone]  for any of the following symptoms:  Following lower endoscopy (colonoscopy, flexible sigmoidoscopy) Excessive amounts of blood in the stool  Significant tenderness, worsening of abdominal pains  Swelling of  the abdomen that is new, acute  Fever of 100 or higher    FOLLOW UP:  If any biopsies were taken you will be contacted by phone or by letter within the next 1-3 weeks. Call [phone]  if you have not heard about the biopsies in 3 weeks.  Please also call with any specific questions about appointments or follow up tests.  

## 2016-03-20 NOTE — Op Note (Signed)
[facility] Patient Name: Meredith Blackwell Procedure Date: 03/20/2016 10:31 AM MRN: [id number] Endoscopist: Mauri Pole , MD Age: 51 Referring MD:  Date of Birth: [birth year] Gender: Female Account #: [id number] Procedure:                Colonoscopy Indications:              Screening for colorectal malignant neoplasm, This                            is the patient's first colonoscopy Medicines:                Monitored Anesthesia Care Procedure:                Pre-Anesthesia Assessment:                           - Prior to the procedure, a History and Physical                            was performed, and patient medications and                            allergies were reviewed. The patient's tolerance of                            previous anesthesia was also reviewed. The risks                            and benefits of the procedure and the sedation                            options and risks were discussed with the patient.                            All questions were answered, and informed consent                            was obtained. Prior Anticoagulants: The patient has                            taken no previous anticoagulant or antiplatelet                            agents. ASA Grade Assessment: II - A patient with                            mild systemic disease. After reviewing the risks                            and benefits, the patient was deemed in                            satisfactory condition to undergo the procedure.  After obtaining informed consent, the colonoscope                            was passed under direct vision. Throughout the                            procedure, the patient's blood pressure, pulse, and                            oxygen saturations were monitored continuously. The                            Colonoscope was introduced through the anus and                            advanced to the the  cecum, identified by                            appendiceal orifice and ileocecal valve. The                            colonoscopy was performed without difficulty. The                            patient tolerated the procedure well. The quality                            of the bowel preparation was excellent. The                            ileocecal valve, appendiceal orifice, and rectum                            were photographed. Scope In: 10:39:46 AM Scope Out: 11:01:15 AM Scope Withdrawal Time: 0 hours 15 minutes 47 seconds  Total Procedure Duration: 0 hours 21 minutes 29 seconds  Findings:                 The perianal and digital rectal examinations were                            normal.                           Two flat polyps were found in the ascending colon                            and cecum. The polyps were 4 to 7 mm in size. These                            polyps were removed with a cold snare. Resection                            and retrieval were complete.  Non-bleeding internal hemorrhoids were found during                            retroflexion. The hemorrhoids were small.                           The exam was otherwise without abnormality. Complications:            No immediate complications. Estimated Blood Loss:     Estimated blood loss was minimal. Impression:               - Two 4 to 7 mm polyps in the ascending colon and                            in the cecum, removed with a cold snare. Resected                            and retrieved.                           - Non-bleeding internal hemorrhoids.                           - The examination was otherwise normal. Recommendation:           - Patient has a contact number available for                            emergencies. The signs and symptoms of potential                            delayed complications were discussed with the                            patient. Return to  normal activities tomorrow.                            Written discharge instructions were provided to the                            patient.                           - Resume previous diet.                           - Continue present medications.                           - Await pathology results.                           - Repeat colonoscopy in 5-10 years for surveillance                            based on pathology results.                           -  Return to GI clinic PRN. Mauri Pole, MD 03/20/2016 11:06:06 AM This report has been signed electronically.

## 2016-03-23 ENCOUNTER — Telehealth: Payer: Self-pay

## 2016-03-23 NOTE — Telephone Encounter (Signed)
  Follow up Call-  Call back number 03/20/2016  Post procedure Call Back phone  # [phone]  Permission to leave phone message Yes  Some recent data might be hidden     Patient questions:  Do you have a fever, pain , or abdominal swelling? No. Pain Score  0 *  Have you tolerated food without any problems? Yes.    Have you been able to return to your normal activities? Yes.    Do you have any questions about your discharge instructions: Diet   No. Medications  No. Follow up visit  No.  Do you have questions or concerns about your Care? No.  Actions: * If pain score is 4 or above: No action needed, pain <4.

## 2016-03-24 ENCOUNTER — Encounter: Payer: Self-pay | Admitting: Gastroenterology

## 2016-03-31 DIAGNOSIS — H40032 Anatomical narrow angle, left eye: Secondary | ICD-10-CM | POA: Diagnosis not present

## 2016-04-13 DIAGNOSIS — H40033 Anatomical narrow angle, bilateral: Secondary | ICD-10-CM | POA: Diagnosis not present

## 2016-04-16 DIAGNOSIS — D508 Other iron deficiency anemias: Secondary | ICD-10-CM | POA: Diagnosis not present

## 2016-04-16 DIAGNOSIS — D509 Iron deficiency anemia, unspecified: Secondary | ICD-10-CM | POA: Diagnosis not present

## 2016-05-11 DIAGNOSIS — D508 Other iron deficiency anemias: Secondary | ICD-10-CM | POA: Diagnosis not present

## 2016-05-11 DIAGNOSIS — Z13820 Encounter for screening for osteoporosis: Secondary | ICD-10-CM | POA: Diagnosis not present

## 2016-05-11 DIAGNOSIS — J45998 Other asthma: Secondary | ICD-10-CM | POA: Diagnosis not present

## 2016-05-11 DIAGNOSIS — R51 Headache: Secondary | ICD-10-CM | POA: Diagnosis not present

## 2016-05-11 DIAGNOSIS — J302 Other seasonal allergic rhinitis: Secondary | ICD-10-CM | POA: Diagnosis not present

## 2016-05-13 ENCOUNTER — Telehealth: Payer: Self-pay | Admitting: *Deleted

## 2016-05-13 NOTE — Telephone Encounter (Signed)
I had a follow up with my doctor about the anemia and had a bone density test on Monday.  My hemoglobin is now normal (12.9)  but the iron is still low (went from 6 to 10 and should be over 40). Colonoscopy showed no issues with bleeding ([place]) so at this time he has said to continue what I am doing to see if the iron will come up more. Will recheck in early September.  I did learn a new diagnosis to add to my list.  The Bone Density test showed Osteopenia. No osteoporosis yet.  Doctor has said i need to: 1- stay active (took 2 walks yesterday and over 10,000 steps in the day) 2- take OTC Vitamin D3 (1000 or 2000iu daily long term) 3- Get GOOD dietary Calcium in (1200 mg daily) - suggested salmon, dairy, broccoli, and to investigate others (that's where you come in??) I emailed to ask if walking was sufficient, did I need a calcium supplement and can this be reversed or are we just trying to prevent osteoporosis and they replied: 1- walking is good enough (yay) 2- can sometimes be reversed with good Vit D, Ca and walking, sometimes not 3-Dietary Ca 1200 mg daily is best - if cannot be achieved then they will suggest a supplement but prefer not to use a supplement if possible. However, Vit D supplement is necessary  Soooo ~ I was using Viactiv which is Calcium plus Vitamin D....so I guess I should stop that?  Is there a difference in OTC Vit D and Vit D3? What foods would you suggest for the calcium?  I do NOT drink milk...tears my stomach up!!  I do eat cheese and yogurt.  Not a fan of salmon but can try.Marland KitchenMarland KitchenHow much of these foods do I need to get 1200mg ? How much broccoli, cheese yogurt, etc.  What the heck do I eat that is Gluten Free, not too many carbs (A1C was 5.9), high in iron, high in calcium and Vit D (that requires some sunshine, right? Not a dietary source?) D vs D3 supplements? DO you have a recommendation? Vitamin and supplements aisle is confusing.  Feeling a bit overwhelmed! Didn't  know everything fell apart at 51 LOL!  Happy to make an appointment if that is needed but this way you have everything in writing to scan and contemplate ??     RESPONSE: I have attached food recommendations for calcium and vitamin d.  I see no issue with you supplementing especially since you don't like milk  Remember, you need carbs.  Blood glucose levels are affected by stress.  Restricting (limiting)energy won't help.  Stress management and walking are great ways to manage A1c.  also medication.  This is not your fault  [place] would be great.  it's finally nice outside  Vitamin D3 is the form that we actually use.  That's the kind that I would take a supplement of. Follow your provider's recommendations for dose.  Attached MNT for osteoporosis

## 2016-06-26 DIAGNOSIS — H40033 Anatomical narrow angle, bilateral: Secondary | ICD-10-CM | POA: Diagnosis not present

## 2016-07-22 DIAGNOSIS — M25561 Pain in right knee: Secondary | ICD-10-CM | POA: Diagnosis not present

## 2016-07-22 DIAGNOSIS — M7631 Iliotibial band syndrome, right leg: Secondary | ICD-10-CM | POA: Diagnosis not present

## 2016-07-30 DIAGNOSIS — M25561 Pain in right knee: Secondary | ICD-10-CM | POA: Diagnosis not present

## 2016-08-04 DIAGNOSIS — M25561 Pain in right knee: Secondary | ICD-10-CM | POA: Diagnosis not present

## 2016-08-20 DIAGNOSIS — M25561 Pain in right knee: Secondary | ICD-10-CM | POA: Diagnosis not present

## 2016-08-25 DIAGNOSIS — M7631 Iliotibial band syndrome, right leg: Secondary | ICD-10-CM | POA: Diagnosis not present

## 2016-09-22 DIAGNOSIS — M7631 Iliotibial band syndrome, right leg: Secondary | ICD-10-CM | POA: Diagnosis not present

## 2016-09-30 DIAGNOSIS — M7631 Iliotibial band syndrome, right leg: Secondary | ICD-10-CM | POA: Diagnosis not present

## 2016-10-05 DIAGNOSIS — S83261A Peripheral tear of lateral meniscus, current injury, right knee, initial encounter: Secondary | ICD-10-CM | POA: Diagnosis not present

## 2016-10-09 DIAGNOSIS — H40033 Anatomical narrow angle, bilateral: Secondary | ICD-10-CM | POA: Diagnosis not present

## 2016-11-04 DIAGNOSIS — S83261D Peripheral tear of lateral meniscus, current injury, right knee, subsequent encounter: Secondary | ICD-10-CM | POA: Diagnosis not present

## 2016-11-05 DIAGNOSIS — D508 Other iron deficiency anemias: Secondary | ICD-10-CM | POA: Diagnosis not present

## 2016-11-12 DIAGNOSIS — M2241 Chondromalacia patellae, right knee: Secondary | ICD-10-CM | POA: Diagnosis not present

## 2016-11-12 DIAGNOSIS — Y999 Unspecified external cause status: Secondary | ICD-10-CM | POA: Diagnosis not present

## 2016-11-12 DIAGNOSIS — X58XXXA Exposure to other specified factors, initial encounter: Secondary | ICD-10-CM | POA: Diagnosis not present

## 2016-11-12 DIAGNOSIS — G8918 Other acute postprocedural pain: Secondary | ICD-10-CM | POA: Diagnosis not present

## 2016-11-12 DIAGNOSIS — S83231A Complex tear of medial meniscus, current injury, right knee, initial encounter: Secondary | ICD-10-CM | POA: Diagnosis not present

## 2016-11-12 DIAGNOSIS — M23321 Other meniscus derangements, posterior horn of medial meniscus, right knee: Secondary | ICD-10-CM | POA: Diagnosis not present

## 2016-11-19 DIAGNOSIS — S83241D Other tear of medial meniscus, current injury, right knee, subsequent encounter: Secondary | ICD-10-CM | POA: Diagnosis not present

## 2016-11-23 DIAGNOSIS — M25561 Pain in right knee: Secondary | ICD-10-CM | POA: Diagnosis not present

## 2016-12-01 DIAGNOSIS — M25561 Pain in right knee: Secondary | ICD-10-CM | POA: Diagnosis not present

## 2016-12-08 DIAGNOSIS — M25561 Pain in right knee: Secondary | ICD-10-CM | POA: Diagnosis not present

## 2016-12-10 DIAGNOSIS — M25561 Pain in right knee: Secondary | ICD-10-CM | POA: Diagnosis not present

## 2016-12-14 DIAGNOSIS — M25561 Pain in right knee: Secondary | ICD-10-CM | POA: Diagnosis not present

## 2016-12-16 DIAGNOSIS — M25561 Pain in right knee: Secondary | ICD-10-CM | POA: Diagnosis not present

## 2016-12-23 DIAGNOSIS — M25561 Pain in right knee: Secondary | ICD-10-CM | POA: Diagnosis not present

## 2016-12-28 DIAGNOSIS — M25561 Pain in right knee: Secondary | ICD-10-CM | POA: Diagnosis not present

## 2017-01-22 ENCOUNTER — Other Ambulatory Visit: Payer: Self-pay | Admitting: Internal Medicine

## 2017-01-22 DIAGNOSIS — Z1231 Encounter for screening mammogram for malignant neoplasm of breast: Secondary | ICD-10-CM

## 2017-01-26 ENCOUNTER — Other Ambulatory Visit: Payer: Self-pay | Admitting: Internal Medicine

## 2017-01-26 DIAGNOSIS — N644 Mastodynia: Secondary | ICD-10-CM

## 2017-01-26 DIAGNOSIS — Z1231 Encounter for screening mammogram for malignant neoplasm of breast: Secondary | ICD-10-CM

## 2017-02-09 ENCOUNTER — Ambulatory Visit: Payer: BLUE CROSS/BLUE SHIELD

## 2017-02-09 ENCOUNTER — Ambulatory Visit
Admission: RE | Admit: 2017-02-09 | Discharge: 2017-02-09 | Disposition: A | Discharge status: Home / Self Care | Payer: BLUE CROSS/BLUE SHIELD | Source: Ambulatory Visit | Attending: Internal Medicine | Admitting: Internal Medicine

## 2017-02-09 DIAGNOSIS — R922 Inconclusive mammogram: Secondary | ICD-10-CM | POA: Diagnosis not present

## 2017-02-09 DIAGNOSIS — N644 Mastodynia: Secondary | ICD-10-CM

## 2017-02-16 DIAGNOSIS — H40033 Anatomical narrow angle, bilateral: Secondary | ICD-10-CM | POA: Diagnosis not present

## 2017-02-26 DIAGNOSIS — H40033 Anatomical narrow angle, bilateral: Secondary | ICD-10-CM | POA: Diagnosis not present

## 2017-03-31 DIAGNOSIS — H40033 Anatomical narrow angle, bilateral: Secondary | ICD-10-CM | POA: Diagnosis not present

## 2017-04-07 DIAGNOSIS — M859 Disorder of bone density and structure, unspecified: Secondary | ICD-10-CM | POA: Diagnosis not present

## 2017-04-07 DIAGNOSIS — Z0000 Encounter for general adult medical examination without abnormal findings: Secondary | ICD-10-CM | POA: Diagnosis not present

## 2017-04-07 DIAGNOSIS — R7301 Impaired fasting glucose: Secondary | ICD-10-CM | POA: Diagnosis not present

## 2017-04-26 DIAGNOSIS — N644 Mastodynia: Secondary | ICD-10-CM | POA: Diagnosis not present

## 2017-04-26 DIAGNOSIS — M8580 Other specified disorders of bone density and structure, unspecified site: Secondary | ICD-10-CM | POA: Diagnosis not present

## 2017-04-26 DIAGNOSIS — D509 Iron deficiency anemia, unspecified: Secondary | ICD-10-CM | POA: Diagnosis not present

## 2017-04-26 DIAGNOSIS — Z0000 Encounter for general adult medical examination without abnormal findings: Secondary | ICD-10-CM | POA: Diagnosis not present

## 2017-04-26 DIAGNOSIS — J0100 Acute maxillary sinusitis, unspecified: Secondary | ICD-10-CM | POA: Diagnosis not present

## 2017-04-26 DIAGNOSIS — Z1389 Encounter for screening for other disorder: Secondary | ICD-10-CM | POA: Diagnosis not present

## 2017-05-15 DIAGNOSIS — L03818 Cellulitis of other sites: Secondary | ICD-10-CM | POA: Diagnosis not present

## 2017-06-02 DIAGNOSIS — L237 Allergic contact dermatitis due to plants, except food: Secondary | ICD-10-CM | POA: Diagnosis not present

## 2017-08-19 DIAGNOSIS — M25512 Pain in left shoulder: Secondary | ICD-10-CM | POA: Diagnosis not present

## 2017-08-19 DIAGNOSIS — M7532 Calcific tendinitis of left shoulder: Secondary | ICD-10-CM | POA: Diagnosis not present

## 2017-09-09 DIAGNOSIS — H40033 Anatomical narrow angle, bilateral: Secondary | ICD-10-CM | POA: Diagnosis not present

## 2017-09-22 DIAGNOSIS — M7542 Impingement syndrome of left shoulder: Secondary | ICD-10-CM | POA: Insufficient documentation

## 2017-09-22 DIAGNOSIS — M25512 Pain in left shoulder: Secondary | ICD-10-CM | POA: Diagnosis not present

## 2017-11-02 DIAGNOSIS — R1313 Dysphagia, pharyngeal phase: Secondary | ICD-10-CM | POA: Diagnosis not present

## 2018-03-17 DIAGNOSIS — H40033 Anatomical narrow angle, bilateral: Secondary | ICD-10-CM | POA: Diagnosis not present

## 2018-04-26 DIAGNOSIS — R7301 Impaired fasting glucose: Secondary | ICD-10-CM | POA: Diagnosis not present

## 2018-04-26 DIAGNOSIS — M8580 Other specified disorders of bone density and structure, unspecified site: Secondary | ICD-10-CM | POA: Diagnosis not present

## 2018-04-26 DIAGNOSIS — Z0000 Encounter for general adult medical examination without abnormal findings: Secondary | ICD-10-CM | POA: Diagnosis not present

## 2018-04-26 DIAGNOSIS — E7849 Other hyperlipidemia: Secondary | ICD-10-CM | POA: Diagnosis not present

## 2018-04-28 DIAGNOSIS — R82998 Other abnormal findings in urine: Secondary | ICD-10-CM | POA: Diagnosis not present

## 2018-04-29 DIAGNOSIS — Z0000 Encounter for general adult medical examination without abnormal findings: Secondary | ICD-10-CM | POA: Diagnosis not present

## 2018-04-29 DIAGNOSIS — R945 Abnormal results of liver function studies: Secondary | ICD-10-CM | POA: Diagnosis not present

## 2018-04-29 DIAGNOSIS — R51 Headache: Secondary | ICD-10-CM | POA: Diagnosis not present

## 2018-04-29 DIAGNOSIS — M8580 Other specified disorders of bone density and structure, unspecified site: Secondary | ICD-10-CM | POA: Diagnosis not present

## 2018-04-29 DIAGNOSIS — D509 Iron deficiency anemia, unspecified: Secondary | ICD-10-CM | POA: Diagnosis not present

## 2018-04-29 DIAGNOSIS — Z1331 Encounter for screening for depression: Secondary | ICD-10-CM | POA: Diagnosis not present

## 2018-05-17 ENCOUNTER — Other Ambulatory Visit: Payer: Self-pay | Admitting: Internal Medicine

## 2018-05-17 DIAGNOSIS — N6310 Unspecified lump in the right breast, unspecified quadrant: Secondary | ICD-10-CM

## 2018-05-20 ENCOUNTER — Ambulatory Visit
Admission: RE | Admit: 2018-05-20 | Discharge: 2018-05-20 | Disposition: A | Discharge status: Home / Self Care | Payer: BLUE CROSS/BLUE SHIELD | Source: Ambulatory Visit | Attending: Internal Medicine | Admitting: Internal Medicine

## 2018-05-20 ENCOUNTER — Inpatient Hospital Stay: Admission: RE | Admit: 2018-05-20 | Payer: BLUE CROSS/BLUE SHIELD | Source: Ambulatory Visit

## 2018-05-20 ENCOUNTER — Other Ambulatory Visit: Payer: Self-pay

## 2018-05-20 DIAGNOSIS — N6310 Unspecified lump in the right breast, unspecified quadrant: Secondary | ICD-10-CM

## 2018-05-20 DIAGNOSIS — N6311 Unspecified lump in the right breast, upper outer quadrant: Secondary | ICD-10-CM | POA: Diagnosis not present

## 2018-07-13 DIAGNOSIS — N61 Mastitis without abscess: Secondary | ICD-10-CM | POA: Diagnosis not present

## 2018-07-13 DIAGNOSIS — R509 Fever, unspecified: Secondary | ICD-10-CM | POA: Diagnosis not present

## 2018-08-19 ENCOUNTER — Other Ambulatory Visit: Payer: Self-pay | Admitting: Internal Medicine

## 2018-08-19 DIAGNOSIS — R922 Inconclusive mammogram: Secondary | ICD-10-CM

## 2018-08-19 DIAGNOSIS — R9230 Dense breasts, unspecified: Secondary | ICD-10-CM

## 2018-08-19 DIAGNOSIS — N63 Unspecified lump in unspecified breast: Secondary | ICD-10-CM

## 2018-09-13 DIAGNOSIS — M545 Low back pain, unspecified: Secondary | ICD-10-CM | POA: Insufficient documentation

## 2018-09-13 DIAGNOSIS — M79671 Pain in right foot: Secondary | ICD-10-CM | POA: Diagnosis not present

## 2018-09-13 DIAGNOSIS — M67471 Ganglion, right ankle and foot: Secondary | ICD-10-CM | POA: Diagnosis not present

## 2018-09-21 DIAGNOSIS — M545 Low back pain: Secondary | ICD-10-CM | POA: Diagnosis not present

## 2018-09-21 DIAGNOSIS — M5126 Other intervertebral disc displacement, lumbar region: Secondary | ICD-10-CM | POA: Diagnosis not present

## 2018-09-21 DIAGNOSIS — M519 Unspecified thoracic, thoracolumbar and lumbosacral intervertebral disc disorder: Secondary | ICD-10-CM | POA: Diagnosis not present

## 2018-09-21 DIAGNOSIS — M51369 Other intervertebral disc degeneration, lumbar region without mention of lumbar back pain or lower extremity pain: Secondary | ICD-10-CM | POA: Insufficient documentation

## 2018-09-21 DIAGNOSIS — M5136 Other intervertebral disc degeneration, lumbar region: Secondary | ICD-10-CM | POA: Diagnosis not present

## 2018-09-22 DIAGNOSIS — H40033 Anatomical narrow angle, bilateral: Secondary | ICD-10-CM | POA: Diagnosis not present

## 2018-09-27 ENCOUNTER — Ambulatory Visit: Payer: BLUE CROSS/BLUE SHIELD

## 2018-09-28 DIAGNOSIS — M5136 Other intervertebral disc degeneration, lumbar region: Secondary | ICD-10-CM | POA: Diagnosis not present

## 2018-09-30 DIAGNOSIS — Z23 Encounter for immunization: Secondary | ICD-10-CM | POA: Diagnosis not present

## 2018-10-06 DIAGNOSIS — M545 Low back pain: Secondary | ICD-10-CM | POA: Diagnosis not present

## 2018-10-06 DIAGNOSIS — M5417 Radiculopathy, lumbosacral region: Secondary | ICD-10-CM | POA: Diagnosis not present

## 2018-10-26 DIAGNOSIS — R7301 Impaired fasting glucose: Secondary | ICD-10-CM | POA: Diagnosis not present

## 2018-10-31 ENCOUNTER — Ambulatory Visit
Admission: RE | Admit: 2018-10-31 | Discharge: 2018-10-31 | Disposition: A | Discharge status: Home / Self Care | Payer: No Typology Code available for payment source | Source: Ambulatory Visit | Attending: Internal Medicine | Admitting: Internal Medicine

## 2018-10-31 DIAGNOSIS — R922 Inconclusive mammogram: Secondary | ICD-10-CM

## 2018-10-31 MED ORDER — GADOBUTROL 1 MMOL/ML IV SOLN
6.0000 mL | Freq: Once | INTRAVENOUS | Status: AC | PRN
  Administered 2018-10-31: 12:00:00 6 mL via INTRAVENOUS

## 2018-11-01 DIAGNOSIS — M5136 Other intervertebral disc degeneration, lumbar region: Secondary | ICD-10-CM | POA: Diagnosis not present

## 2018-11-20 IMAGING — MG 2D DIGITAL DIAGNOSTIC BILATERAL MAMMOGRAM WITH CAD AND ADJUNCT T
8 of 12 series · 8 of 28 positions shown · non-contrast
Comparison: Previous exam(s).

CLINICAL DATA: 51-year-old female with diffuse, intermittent
lateral right breast pain for approximately 2 and half weeks.

EXAM:
2D DIGITAL DIAGNOSTIC BILATERAL MAMMOGRAM WITH CAD AND ADJUNCT TOMO

[R MLO synth-2D]
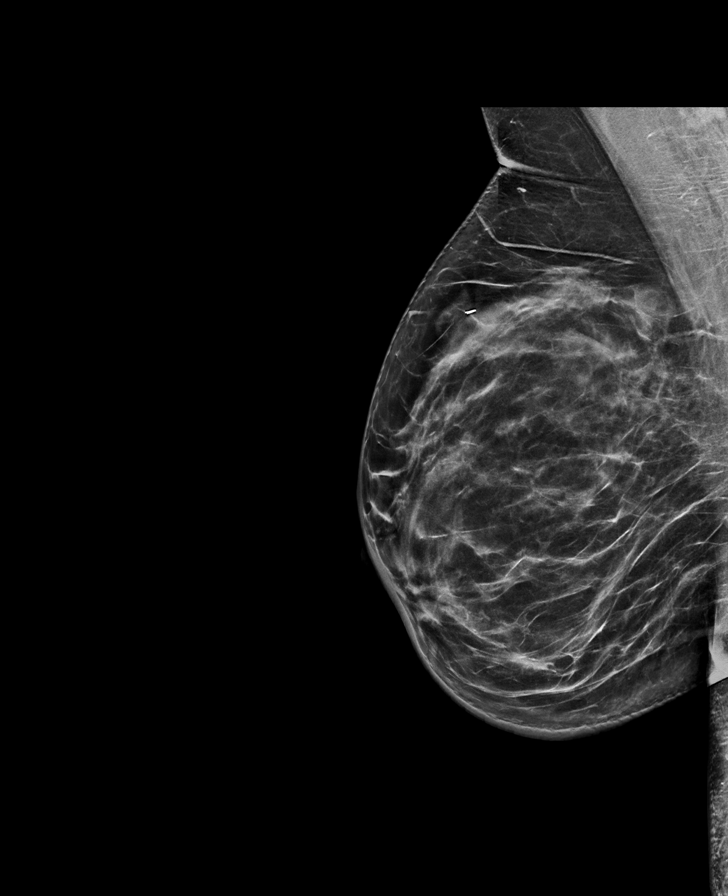

[L CC]
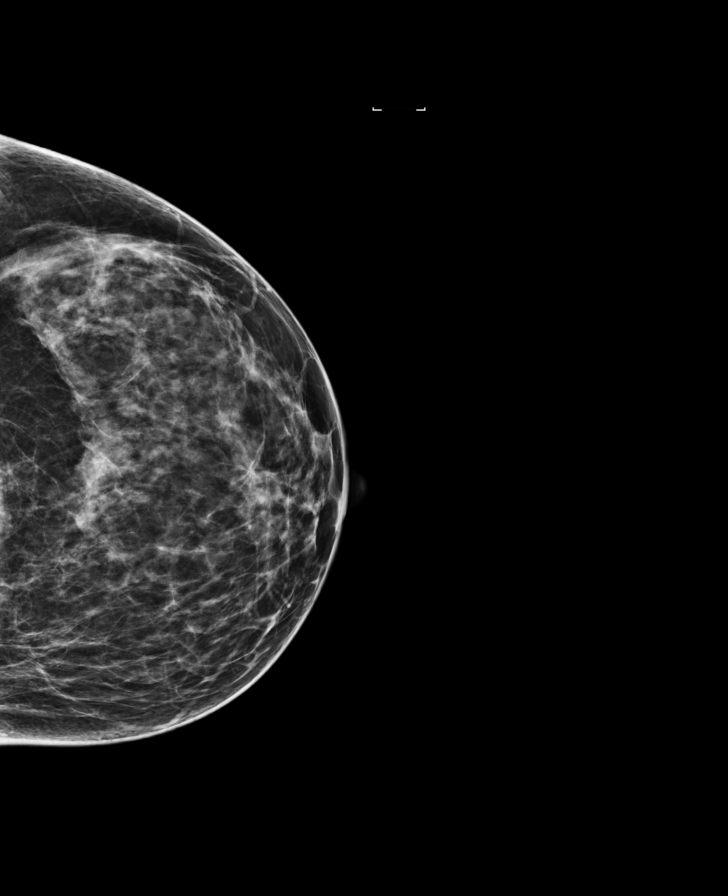

[L CC synth-2D]
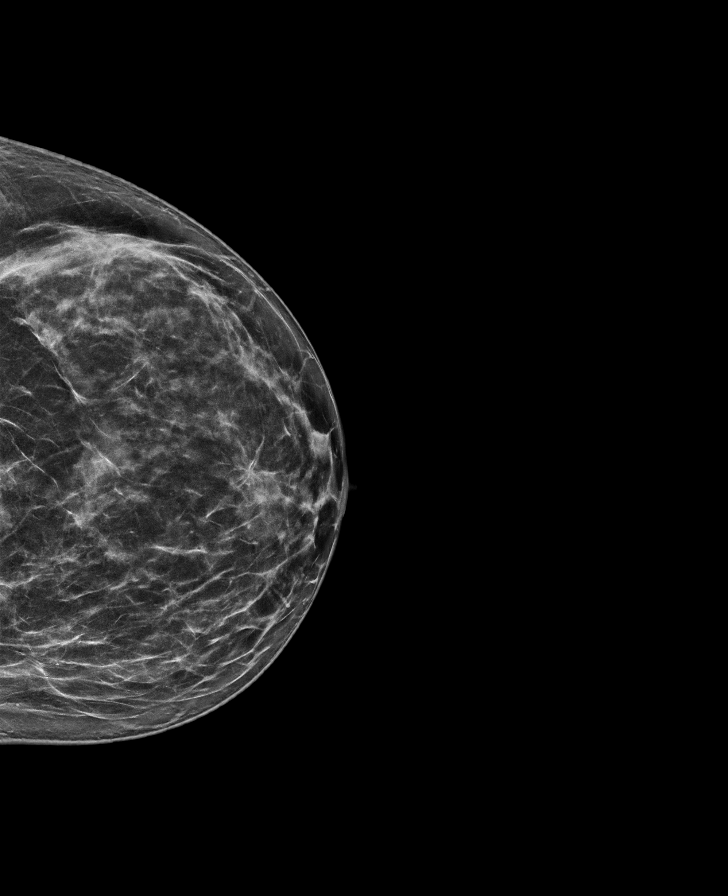

[L MLO]
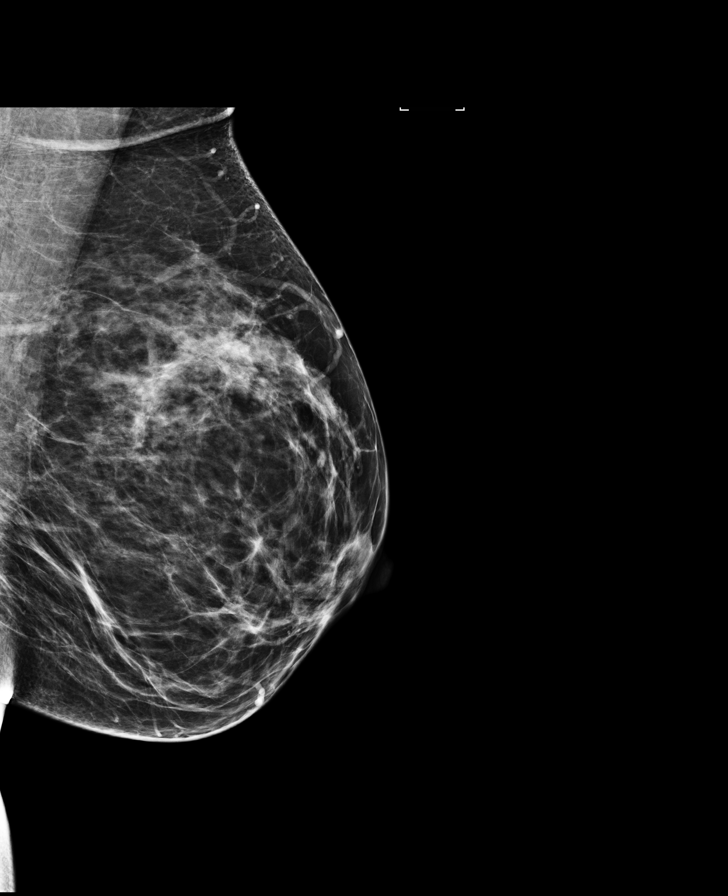

[L MLO synth-2D]
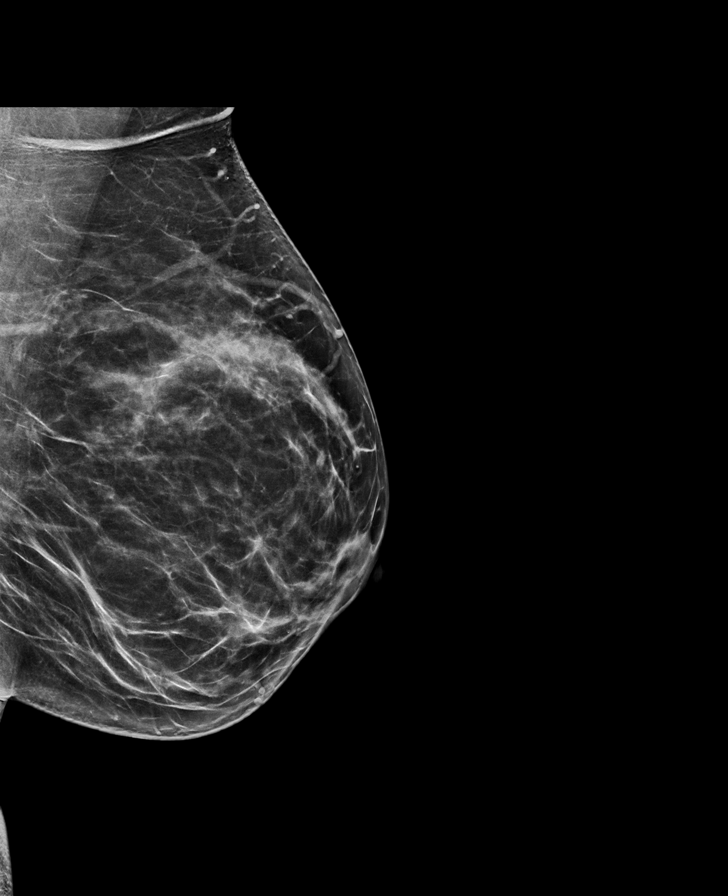

[R CC synth-2D]
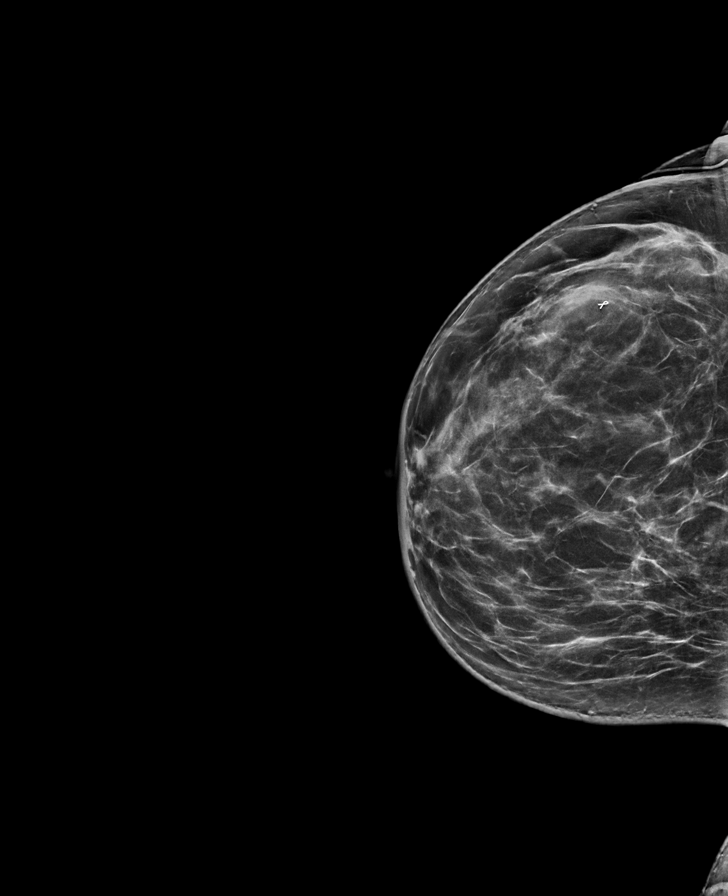

[R MLO]
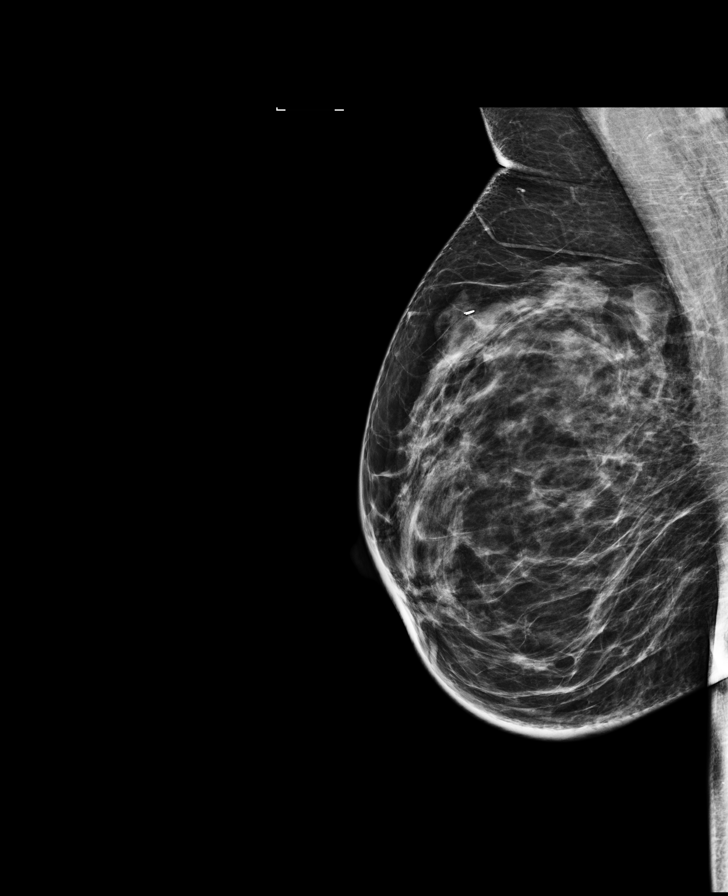

[R CC]
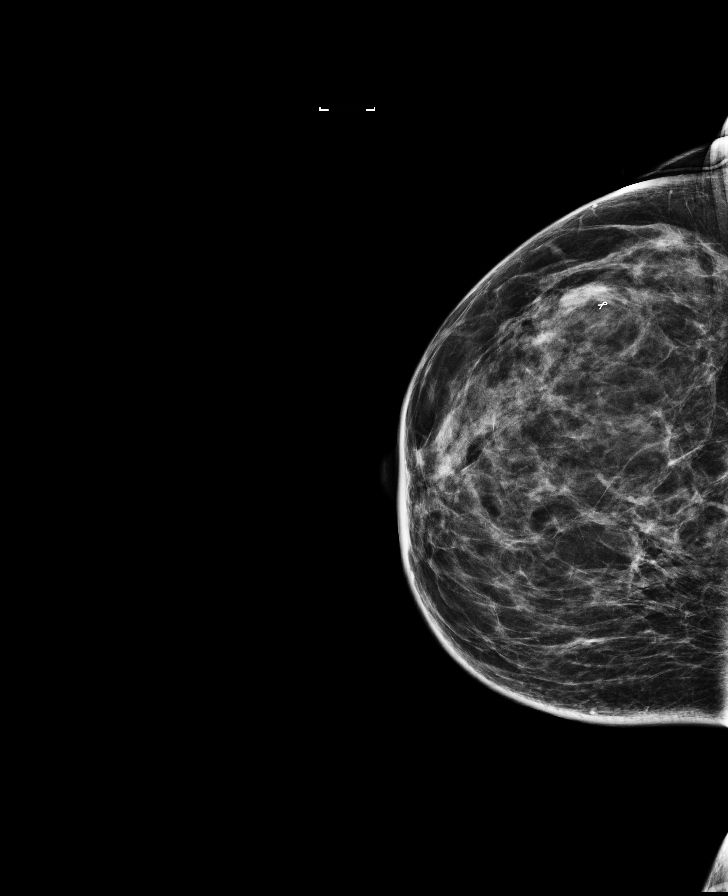

[8 of 28 positions shown; findings below may reference images not displayed]

ACR Breast Density Category c: The breast tissue is heterogeneously
dense, which may obscure small masses.
FINDINGS: Post biopsy tissue marker is again identified in the upper outer
right breast. In no suspicious mammographic findings are identified
in either breast.

Mammographic images were processed with CAD.
IMPRESSION: No mammographic evidence of malignancy in either breast.

RECOMMENDATION:
1. Clinical follow-up recommended for the diffuse area of pain in
the right breast. Any further workup should be based on clinical
grounds. Patient was encouraged to follow-up with referring
physician if pain became localized and persistent or if a palpable
lump/mass developed.
2.  Screening mammogram in one year.(Code:[id number])

I have discussed the findings and recommendations with the patient.
Results were also provided in writing at the conclusion of the
visit. If applicable, a reminder letter will be sent to the patient
regarding the next appointment.

BI-RADS CATEGORY  2: Benign.

## 2018-11-21 DIAGNOSIS — M67471 Ganglion, right ankle and foot: Secondary | ICD-10-CM | POA: Diagnosis not present

## 2018-11-24 DIAGNOSIS — M8589 Other specified disorders of bone density and structure, multiple sites: Secondary | ICD-10-CM | POA: Diagnosis not present

## 2018-11-29 DIAGNOSIS — Z713 Dietary counseling and surveillance: Secondary | ICD-10-CM | POA: Diagnosis not present

## 2018-12-13 DIAGNOSIS — Z713 Dietary counseling and surveillance: Secondary | ICD-10-CM | POA: Diagnosis not present

## 2018-12-28 DIAGNOSIS — M859 Disorder of bone density and structure, unspecified: Secondary | ICD-10-CM | POA: Diagnosis not present

## 2018-12-29 DIAGNOSIS — Z713 Dietary counseling and surveillance: Secondary | ICD-10-CM | POA: Diagnosis not present

## 2019-01-11 ENCOUNTER — Ambulatory Visit: Payer: BC Managed Care – PPO | Attending: Internal Medicine

## 2019-01-11 DIAGNOSIS — Z20828 Contact with and (suspected) exposure to other viral communicable diseases: Secondary | ICD-10-CM | POA: Diagnosis not present

## 2019-01-11 DIAGNOSIS — Z20822 Contact with and (suspected) exposure to covid-19: Secondary | ICD-10-CM

## 2019-01-12 LAB — NOVEL CORONAVIRUS, NAA: SARS-CoV-2, NAA: NOT DETECTED

## 2019-01-18 ENCOUNTER — Ambulatory Visit: Payer: BC Managed Care – PPO | Attending: Internal Medicine

## 2019-01-18 DIAGNOSIS — Z20822 Contact with and (suspected) exposure to covid-19: Secondary | ICD-10-CM | POA: Diagnosis not present

## 2019-01-19 LAB — NOVEL CORONAVIRUS, NAA: SARS-CoV-2, NAA: NOT DETECTED

## 2019-01-24 DIAGNOSIS — Z713 Dietary counseling and surveillance: Secondary | ICD-10-CM | POA: Diagnosis not present

## 2019-03-07 DIAGNOSIS — Z713 Dietary counseling and surveillance: Secondary | ICD-10-CM | POA: Diagnosis not present

## 2019-03-08 DIAGNOSIS — M542 Cervicalgia: Secondary | ICD-10-CM | POA: Diagnosis not present

## 2019-03-28 DIAGNOSIS — H40033 Anatomical narrow angle, bilateral: Secondary | ICD-10-CM | POA: Diagnosis not present

## 2019-04-18 DIAGNOSIS — H40033 Anatomical narrow angle, bilateral: Secondary | ICD-10-CM | POA: Diagnosis not present

## 2019-05-23 DIAGNOSIS — M859 Disorder of bone density and structure, unspecified: Secondary | ICD-10-CM | POA: Diagnosis not present

## 2019-05-23 DIAGNOSIS — E7849 Other hyperlipidemia: Secondary | ICD-10-CM | POA: Diagnosis not present

## 2019-05-23 DIAGNOSIS — R7301 Impaired fasting glucose: Secondary | ICD-10-CM | POA: Diagnosis not present

## 2019-05-23 DIAGNOSIS — Z0000 Encounter for general adult medical examination without abnormal findings: Secondary | ICD-10-CM | POA: Diagnosis not present

## 2019-05-30 DIAGNOSIS — M8580 Other specified disorders of bone density and structure, unspecified site: Secondary | ICD-10-CM | POA: Diagnosis not present

## 2019-05-30 DIAGNOSIS — N6019 Diffuse cystic mastopathy of unspecified breast: Secondary | ICD-10-CM | POA: Diagnosis not present

## 2019-05-30 DIAGNOSIS — R945 Abnormal results of liver function studies: Secondary | ICD-10-CM | POA: Diagnosis not present

## 2019-05-30 DIAGNOSIS — Z1331 Encounter for screening for depression: Secondary | ICD-10-CM | POA: Diagnosis not present

## 2019-05-30 DIAGNOSIS — E559 Vitamin D deficiency, unspecified: Secondary | ICD-10-CM | POA: Diagnosis not present

## 2019-05-30 DIAGNOSIS — R82998 Other abnormal findings in urine: Secondary | ICD-10-CM | POA: Diagnosis not present

## 2019-05-30 DIAGNOSIS — Z0000 Encounter for general adult medical examination without abnormal findings: Secondary | ICD-10-CM | POA: Diagnosis not present

## 2019-08-15 DIAGNOSIS — D2372 Other benign neoplasm of skin of left lower limb, including hip: Secondary | ICD-10-CM | POA: Diagnosis not present

## 2019-08-15 DIAGNOSIS — D225 Melanocytic nevi of trunk: Secondary | ICD-10-CM | POA: Diagnosis not present

## 2019-08-15 DIAGNOSIS — D2261 Melanocytic nevi of right upper limb, including shoulder: Secondary | ICD-10-CM | POA: Diagnosis not present

## 2019-08-15 DIAGNOSIS — D2371 Other benign neoplasm of skin of right lower limb, including hip: Secondary | ICD-10-CM | POA: Diagnosis not present

## 2019-10-23 DIAGNOSIS — M545 Low back pain, unspecified: Secondary | ICD-10-CM | POA: Diagnosis not present

## 2019-10-23 DIAGNOSIS — M5136 Other intervertebral disc degeneration, lumbar region: Secondary | ICD-10-CM | POA: Diagnosis not present

## 2019-11-28 DIAGNOSIS — R7301 Impaired fasting glucose: Secondary | ICD-10-CM | POA: Diagnosis not present

## 2019-11-28 DIAGNOSIS — Z23 Encounter for immunization: Secondary | ICD-10-CM | POA: Diagnosis not present

## 2019-11-28 DIAGNOSIS — E785 Hyperlipidemia, unspecified: Secondary | ICD-10-CM | POA: Diagnosis not present

## 2020-06-03 DIAGNOSIS — N907 Vulvar cyst: Secondary | ICD-10-CM | POA: Diagnosis not present

## 2020-06-03 DIAGNOSIS — Z6829 Body mass index (BMI) 29.0-29.9, adult: Secondary | ICD-10-CM | POA: Diagnosis not present

## 2020-06-03 DIAGNOSIS — M8580 Other specified disorders of bone density and structure, unspecified site: Secondary | ICD-10-CM | POA: Insufficient documentation

## 2020-06-03 DIAGNOSIS — N952 Postmenopausal atrophic vaginitis: Secondary | ICD-10-CM | POA: Diagnosis not present

## 2020-06-03 DIAGNOSIS — Z01419 Encounter for gynecological examination (general) (routine) without abnormal findings: Secondary | ICD-10-CM | POA: Diagnosis not present

## 2020-06-04 DIAGNOSIS — E785 Hyperlipidemia, unspecified: Secondary | ICD-10-CM | POA: Diagnosis not present

## 2020-06-04 DIAGNOSIS — E559 Vitamin D deficiency, unspecified: Secondary | ICD-10-CM | POA: Diagnosis not present

## 2020-06-04 DIAGNOSIS — R7301 Impaired fasting glucose: Secondary | ICD-10-CM | POA: Diagnosis not present

## 2020-06-12 DIAGNOSIS — Z0000 Encounter for general adult medical examination without abnormal findings: Secondary | ICD-10-CM | POA: Diagnosis not present

## 2020-06-12 DIAGNOSIS — D229 Melanocytic nevi, unspecified: Secondary | ICD-10-CM | POA: Diagnosis not present

## 2020-06-12 DIAGNOSIS — R82998 Other abnormal findings in urine: Secondary | ICD-10-CM | POA: Diagnosis not present

## 2020-06-12 DIAGNOSIS — Z1331 Encounter for screening for depression: Secondary | ICD-10-CM | POA: Diagnosis not present

## 2020-06-13 DIAGNOSIS — Z713 Dietary counseling and surveillance: Secondary | ICD-10-CM | POA: Diagnosis not present

## 2020-07-08 ENCOUNTER — Inpatient Hospital Stay: Payer: BC Managed Care – PPO

## 2020-07-08 ENCOUNTER — Encounter: Payer: Self-pay | Admitting: Licensed Clinical Social Worker

## 2020-07-08 ENCOUNTER — Other Ambulatory Visit: Payer: Self-pay

## 2020-07-08 ENCOUNTER — Inpatient Hospital Stay: Payer: BC Managed Care – PPO | Attending: Genetic Counselor | Admitting: Licensed Clinical Social Worker

## 2020-07-08 DIAGNOSIS — Z803 Family history of malignant neoplasm of breast: Secondary | ICD-10-CM

## 2020-07-08 DIAGNOSIS — Z8052 Family history of malignant neoplasm of bladder: Secondary | ICD-10-CM

## 2020-07-08 DIAGNOSIS — Z808 Family history of malignant neoplasm of other organs or systems: Secondary | ICD-10-CM

## 2020-07-08 DIAGNOSIS — Z8041 Family history of malignant neoplasm of ovary: Secondary | ICD-10-CM | POA: Diagnosis not present

## 2020-07-08 DIAGNOSIS — Z8481 Family history of carrier of genetic disease: Secondary | ICD-10-CM

## 2020-07-08 LAB — GENETIC SCREENING ORDER

## 2020-07-08 NOTE — Progress Notes (Signed)
REFERRING PROVIDER: Self  PRIMARY PROVIDER:  Crist Infante, MD  PRIMARY REASON FOR VISIT:  1. Family history of BRCA gene mutation   2. Family history of breast cancer   3. Family history of ovarian cancer   4. Family history of bladder cancer   5. Family history of melanoma      HISTORY OF PRESENT ILLNESS:   Meredith Blackwell, a 55 y.o. female, was seen for a [place] cancer genetics consultation due to a family history of cancer and her sister's recent genetic testing that revealed a BRCA2 mutation.  Meredith Blackwell presents to clinic today to discuss the possibility of a hereditary predisposition to cancer, genetic testing, and to further clarify her future cancer risks, as well as potential cancer risks for family members.    Meredith Blackwell is a 55 y.o. female with no personal history of cancer.    CANCER HISTORY:  Oncology History   No history exists.     RISK FACTORS:  Menarche was at age 57.  First live birth at age 52.  OCP use: yes Ovaries intact: no.  Hysterectomy: yes.  Menopausal status: postmenopausal.  HRT use: 0 years. Colonoscopy: yes; normal. Mammogram within the last year: yes. Number of breast biopsies: 2. Up to date with pelvic exams: yes.  Past Medical History:  Diagnosis Date   Abdominal pain    Anal spasm    Anemia    Asthma    Constipation    Diarrhea    Dysphagia    Environmental and seasonal allergies    Family history of bladder cancer    Family history of breast cancer    Family history of melanoma    Family history of ovarian cancer    GERD (gastroesophageal reflux disease)    Glaucoma    Hemorrhoid    Nausea     Past Surgical History:  Procedure Laterality Date   ABDOMINAL HYSTERECTOMY     pt says hysterectomy was vaginal   APPENDECTOMY     BACK SURGERY     BREAST EXCISIONAL BIOPSY     BREAST LUMPECTOMY     NOSE SURGERY     SHOULDER SURGERY     TONSILLECTOMY     WISDOM TOOTH EXTRACTION      Social History   Socioeconomic  History   Marital status: Married    Spouse name: Not on file   Number of children: Not on file   Years of education: Not on file   Highest education level: Not on file  Occupational History   Not on file  Tobacco Use   Smoking status: Never   Smokeless tobacco: Never  Substance and Sexual Activity   Alcohol use: Yes    Comment: 3 glasses of wine a week.   Drug use: No   Sexual activity: Not on file  Other Topics Concern   Not on file  Social History Narrative   Not on file   Social Determinants of Health   Financial Resource Strain: Not on file  Food Insecurity: Not on file  Transportation Needs: Not on file  Physical Activity: Not on file  Stress: Not on file  Social Connections: Not on file     FAMILY HISTORY:  We obtained a detailed, 4-generation family history.  Significant diagnoses are listed below: Family History  Problem Relation Age of Onset   Diabetes Other    Hyperlipidemia Other    Stroke Other    Cancer Other    Asthma Other  Breast cancer Sister    Breast cancer Maternal Aunt    Breast cancer Cousin    Colon cancer Neg Hx    Esophageal cancer Neg Hx    Rectal cancer Neg Hx    Stomach cancer Neg Hx    Meredith Blackwell has 2 sons, 2 daughters. She has 4 sisters, 1 brother. Her sister, Collie Siad, was diagnosed with ovarian cancer at 74 and likely had genetic testing, unsure of results.   [name] mother had melanoma on her back at 52 but did not pass from this, she died recently at 37. Patient had 4 maternal aunts. One aunt had breast cancer at 71 and skin cancer. A maternal cousin had breast cancer at 55 and again at 26 and died at 30. Maternal grandmother died at 65, grandfather died at 18.   Meredith Blackwell's father had bladder cancer at 61 and died at 79. Patient has limited information about this side of the family. She had 4 paternal aunts and 2 uncles, no cancers. Paternal cousin had breast cancer. Paternal grandmother passed at 77, grandfather passed at  41.   Meredith Blackwell is unaware of previous family history of genetic testing for hereditary cancer risks.  There is no reported Ashkenazi Jewish ancestry. There is no known consanguinity.   GENETIC COUNSELING ASSESSMENT: Meredith Blackwell is a 55 y.o. female with a family history of a BRCA2 mutation.  We, therefore, discussed and recommended the following at today's visit.   DISCUSSION: We discussed that approximately 5-10% of cancer is hereditary. We discussed the BRCA2 gene in detail, noting cancers, cancer risks and potential management changes. We discussed that testing is beneficial for several reasons including knowing about cancer risks, identifying potential screening and risk-reduction options that may be appropriate, and to understand if other family members could be at risk for cancer and allow them to undergo genetic testing.   We reviewed the characteristics, features and inheritance patterns of hereditary cancer syndromes. We also discussed genetic testing, including the appropriate family members to test, the process of testing, insurance coverage and turn-around-time for results. We discussed the implications of a negative, positive and/or variant of uncertain significant result. We recommended Meredith Blackwell pursue genetic testing for the BRCA2 family variant testing.  Based on Meredith Blackwell's family history of cancer, she meets medical criteria for genetic testing. The testing will be performed at no cost through the lab's family variant testing program.   PLAN: After considering the risks, benefits, and limitations, Meredith Blackwell provided informed consent to pursue genetic testing and the blood sample was sent to [facility] for analysis of the family variant in [place]. Results should be available within approximately 2-3 weeks' time, at which point they will be disclosed by telephone to Meredith Blackwell, as will any additional recommendations warranted by these results. Meredith Blackwell will receive a  summary of her genetic counseling visit and a copy of her results once available. This information will also be available in Epic.   Meredith Blackwell's questions were answered to her satisfaction today. Our contact information was provided should additional questions or concerns arise. Thank you for the referral and allowing [place] to share in the care of your patient.   Faith Rogue, MS, [facility] Genetic Counselor [email or web address] Phone: [phone]  The patient was seen for a total of 25 minutes in face-to-face genetic counseling.  Patient was seen alone. Drs. Magrinat/Gudena/and/or Burr Medico were available for discussion regarding this case.   _______________________________________________________________________ For Office Staff:  Number of people involved  in session: 1 Was an Intern/ student involved with case: no

## 2020-07-30 ENCOUNTER — Telehealth: Payer: Self-pay | Admitting: Licensed Clinical Social Worker

## 2020-07-30 ENCOUNTER — Ambulatory Visit: Payer: Self-pay | Admitting: Licensed Clinical Social Worker

## 2020-07-30 ENCOUNTER — Encounter: Payer: Self-pay | Admitting: Licensed Clinical Social Worker

## 2020-07-30 DIAGNOSIS — Z1379 Encounter for other screening for genetic and chromosomal anomalies: Secondary | ICD-10-CM | POA: Insufficient documentation

## 2020-07-30 DIAGNOSIS — Z8052 Family history of malignant neoplasm of bladder: Secondary | ICD-10-CM

## 2020-07-30 DIAGNOSIS — Z803 Family history of malignant neoplasm of breast: Secondary | ICD-10-CM

## 2020-07-30 DIAGNOSIS — Z8041 Family history of malignant neoplasm of ovary: Secondary | ICD-10-CM

## 2020-07-30 DIAGNOSIS — Z808 Family history of malignant neoplasm of other organs or systems: Secondary | ICD-10-CM

## 2020-07-30 NOTE — Telephone Encounter (Signed)
Revealed negative genetic testing for the known pathogenic variant in BRCA2. This result is a true negative. This normal result is reassuring .  It is unlikely that there is an increased risk of cancer due to a mutation in this gene. There are other genes we can test if she is interested.  It will be important for her to keep in contact with genetics to learn if any additional testing may be needed in the future.

## 2020-07-30 NOTE — Progress Notes (Signed)
HPI:  [name] was previously seen in the [place] clinic due to a family history of cancer , her sister's recent genetic testing that revealed a BRCA2 pathogenic variant, and concerns regarding a hereditary predisposition to cancer. Please refer to our prior cancer genetics clinic note for more information regarding our discussion, assessment and recommendations, at the time. [name]'s recent genetic test results were disclosed to her, as were recommendations warranted by these results. These results and recommendations are discussed in more detail below.  CANCER HISTORY:  Oncology History   No history exists.    FAMILY HISTORY:  We obtained a detailed, 4-generation family history.  Significant diagnoses are listed below: Family History  Problem Relation Age of Onset   Diabetes Other    Hyperlipidemia Other    Stroke Other    Cancer Other    Asthma Other    Breast cancer Sister    Breast cancer Maternal Aunt    Breast cancer Cousin    Colon cancer Neg Hx    Esophageal cancer Neg Hx    Rectal cancer Neg Hx    Stomach cancer Neg Hx     [name] has 2 sons, 2 daughters. She has 4 sisters, 1 brother. Her sister, Meredith Blackwell, was diagnosed with ovarian cancer at 35 and likely had genetic testing, unsure of results. Her sister, Meredith Blackwell, had genetic testing recently that revealed a BRCA2 pathogenic variant. She had breast cancer at 71.    [name]''s mother had melanoma on her back at 75 but did not pass from this, she died recently at 73. Patient had 4 maternal aunts. One aunt had breast cancer at 23 and skin cancer. A maternal cousin had breast cancer at 29 and again at 51 and died at 75. Maternal grandmother died at 53, grandfather died at 24.   [name]'s father had bladder cancer at 10 and died at 87. Patient has limited information about this side of the family. She had 4 paternal aunts and 2 uncles, no cancers. Paternal cousin had breast cancer. Paternal grandmother  passed at 40, grandfather passed at 72.   [name] is unaware of previous family history of genetic testing for hereditary cancer risks.  There is no reported Ashkenazi Jewish ancestry. There is no known consanguinity.     GENETIC TEST RESULTS: Genetic testing reported out on 07/22/2020 through the [facility] family variant testing did not identify the known familial pathogenic variant in BRCA2.   The test report has been scanned into EPIC and is located under the Molecular Pathology section of the Results Review tab.  A portion of the result report is included below for reference.     We recommended [name] pursue testing for the familial hereditary cancer gene mutation called BRCA2 [id number]. [name]'s test was normal and did not reveal the familial mutation. We call this result a true negative result because the cancer-causing mutation was identified in [name]'s family, and she did not inherit it.  Given this negative result, [name]'s chances of developing BRCA2-related cancers are likely the same as they are in the general population.    ADDITIONAL GENETIC TESTING: We discussed with [name] that her genetic testing was fairly extensive.  If there are genes identified to increase cancer risk that can be analyzed in the future, we would be happy to discuss and coordinate this testing at that time.    CANCER SCREENING RECOMMENDATIONS: [name] test result is considered negative (normal).  While reassuring, this does not definitively rule out a hereditary predisposition to cancer. It is still possible that there could be genetic mutations that are undetectable by current technology. There could be genetic mutations in genes that have not been tested or identified to increase cancer risk.  Therefore, it is recommended she continue to follow the cancer management and screening guidelines provided by her primary healthcare provider.   An individual's cancer risk and medical  management are not determined by genetic test results alone. Overall cancer risk assessment incorporates additional factors, including personal medical history, family history, and any available genetic information that may result in a personalized plan for cancer prevention and surveillance.  Based on [name]'s personal and family history of cancer as well as her genetic test results, risk model Harriett Rush was used to estimate her risk of developing breast cancer. This estimates her lifetime risk of developing breast cancer to be approximately 7.7%.  The patient's lifetime breast cancer risk is a preliminary estimate based on available information using one of several models endorsed by the [employer] (ACS). The ACS recommends consideration of breast MRI screening as an adjunct to mammography for patients at high risk (defined as 20% or greater lifetime risk).     RECOMMENDATIONS FOR FAMILY MEMBERS:  Relatives in this family might be at some increased risk of developing cancer, over the general population risk, simply due to the family history of cancer.  We recommended female relatives in this family have a yearly mammogram beginning at age 51, or 67 years younger than the earliest onset of cancer, an annual clinical breast exam, and perform monthly breast self-exams. Female relatives in this family should also have a gynecological exam as recommended by their primary provider.  All family members should be referred for colonoscopy starting at age 35.   Based on [name]'s family history, we recommended all those related to her sister who tested positive for BRCA2 have genetic counseling and testing. [name] will let us know if we can be of any assistance in coordinating genetic counseling and/or testing for these family members.  FOLLOW-UP: Lastly, we discussed with [name] that cancer genetics is a rapidly advancing field and it is possible that new genetic tests will be  appropriate for her and/or her family members in the future. We encouraged her to remain in contact with cancer genetics on an annual basis so we can update her personal and family histories and let her know of advances in cancer genetics that may benefit this family.   Our contact number was provided. [name]'s questions were answered to her satisfaction, and she knows she is welcome to call us at anytime with additional questions or concerns.   Meredith Rogue, MS, [facility] Genetic Counselor [email or web address] Phone: [phone]

## 2020-08-13 DIAGNOSIS — D225 Melanocytic nevi of trunk: Secondary | ICD-10-CM | POA: Diagnosis not present

## 2020-08-13 DIAGNOSIS — D2371 Other benign neoplasm of skin of right lower limb, including hip: Secondary | ICD-10-CM | POA: Diagnosis not present

## 2020-08-13 DIAGNOSIS — D2261 Melanocytic nevi of right upper limb, including shoulder: Secondary | ICD-10-CM | POA: Diagnosis not present

## 2020-08-13 DIAGNOSIS — D2372 Other benign neoplasm of skin of left lower limb, including hip: Secondary | ICD-10-CM | POA: Diagnosis not present

## 2020-08-29 DIAGNOSIS — J0111 Acute recurrent frontal sinusitis: Secondary | ICD-10-CM | POA: Diagnosis not present

## 2020-09-04 ENCOUNTER — Ambulatory Visit: Payer: BC Managed Care – PPO | Admitting: Neurology

## 2020-11-01 DIAGNOSIS — E785 Hyperlipidemia, unspecified: Secondary | ICD-10-CM | POA: Diagnosis not present

## 2020-11-01 DIAGNOSIS — R7301 Impaired fasting glucose: Secondary | ICD-10-CM | POA: Diagnosis not present

## 2020-11-20 ENCOUNTER — Other Ambulatory Visit: Payer: Self-pay | Admitting: Internal Medicine

## 2020-11-20 DIAGNOSIS — Z1231 Encounter for screening mammogram for malignant neoplasm of breast: Secondary | ICD-10-CM

## 2020-11-21 ENCOUNTER — Ambulatory Visit (INDEPENDENT_AMBULATORY_CARE_PROVIDER_SITE_OTHER): Payer: BC Managed Care – PPO | Admitting: Neurology

## 2020-11-21 ENCOUNTER — Encounter: Payer: Self-pay | Admitting: Neurology

## 2020-11-21 VITALS — BP 121/76 | HR 78 | Ht 67.0 in | Wt 178.0 lb

## 2020-11-21 DIAGNOSIS — G43909 Migraine, unspecified, not intractable, without status migrainosus: Secondary | ICD-10-CM | POA: Insufficient documentation

## 2020-11-21 DIAGNOSIS — G43109 Migraine with aura, not intractable, without status migrainosus: Secondary | ICD-10-CM | POA: Diagnosis not present

## 2020-11-21 DIAGNOSIS — H539 Unspecified visual disturbance: Secondary | ICD-10-CM

## 2020-11-21 DIAGNOSIS — R51 Headache with orthostatic component, not elsewhere classified: Secondary | ICD-10-CM

## 2020-11-21 DIAGNOSIS — M5416 Radiculopathy, lumbar region: Secondary | ICD-10-CM | POA: Diagnosis not present

## 2020-11-21 DIAGNOSIS — N951 Menopausal and female climacteric states: Secondary | ICD-10-CM | POA: Diagnosis not present

## 2020-11-21 DIAGNOSIS — G43711 Chronic migraine without aura, intractable, with status migrainosus: Secondary | ICD-10-CM

## 2020-11-21 DIAGNOSIS — R519 Headache, unspecified: Secondary | ICD-10-CM

## 2020-11-21 MED ORDER — ONDANSETRON 4 MG PO TBDP
4.0000 mg | ORAL_TABLET | Freq: Three times a day (TID) | ORAL | 3 refills | Status: DC | PRN
Start: 2020-11-21 — End: 2021-12-16

## 2020-11-21 MED ORDER — EMGALITY 120 MG/ML ~~LOC~~ SOAJ: 120.0000 mg | SUBCUTANEOUS | 11 refills | Status: DC

## 2020-11-21 MED ORDER — RIZATRIPTAN BENZOATE 10 MG PO TBDP: 10.0000 mg | ORAL_TABLET | ORAL | 11 refills | Status: DC | PRN

## 2020-11-21 MED ORDER — GALCANEZUMAB-GNLM 120 MG/ML ~~LOC~~ SOAJ: 240.0000 mg | Freq: Once | SUBCUTANEOUS | Status: DC

## 2020-11-21 MED ORDER — NURTEC 75 MG PO TBDP: 75.0000 mg | ORAL_TABLET | Freq: Every day | ORAL | 0 refills | Status: DC | PRN

## 2020-11-21 MED ORDER — GABAPENTIN 300 MG PO CAPS: 300.0000 mg | ORAL_CAPSULE | Freq: Every day | ORAL | 1 refills | Status: DC

## 2020-11-21 NOTE — Patient Instructions (Addendum)
- Migraine/headache prevention: Start Emgality. 2 injections today and then one injection monthly. F/u  3 months to see if helping, can mychart message me in the meantime - Acute/emergent: Try Rizatriptan: Please take one tablet at the onset of your headache. If it does not improve the symptoms please take one additional tablet. Do not take more then 2 tablets in 24hrs. Do not take use more then 2 to 3 times in a week. - Acute/Emergent: Try Nurtec - Ondansetron: May take for nause motion sickness - Nurtec, maxalt and Ondansetron may be taken together for severe migraine/headache - MRI brain - Consider sleep study  There is increased risk for stroke in women with migraine with aura and a contraindication for the combined contraceptive pill for use by women who have migraine with aura. The risk for women with migraine without aura is lower. However other risk factors like smoking are far more likely to increase stroke risk than migraine. There is a recommendation for no smoking and for the use of OCPs without estrogen such as progestogen only pills particularly for women with migraine with aura.Marland Kitchen People who have migraine headaches with auras may be 3 times more likely to have a stroke caused by a blood clot, compared to migraine patients who don't see auras. Women who take hormone-replacement therapy may be 30 percent more likely to suffer a clot-based stroke than women not taking medication containing estrogen. Other risk factors like smoking and high blood pressure may be  much more important.   Rimegepant oral dissolving tablet What is this medication? RIMEGEPANT (ri ME je pant) is used to treat migraine headaches with or without aura. An aura is a strange feeling or visual disturbance that warns you of an attack. It is also used to prevent migraine headaches. This medicine may be used for other purposes; ask your health care provider or pharmacist if you have questions. COMMON BRAND NAME(S): NURTEC  ODT What should I tell my care team before I take this medication? They need to know if you have any of these conditions: kidney disease liver disease an unusual or allergic reaction to rimegepant, other medicines, foods, dyes, or preservatives pregnant or trying to get pregnant breast-feeding How should I use this medication? Take the medicine by mouth. Follow the directions on the prescription label. Leave the tablet in the sealed blister pack until you are ready to take it. With dry hands, open the blister and gently remove the tablet. If the tablet breaks or crumbles, throw it away and take a new tablet out of the blister pack. Place the tablet in the mouth and allow it to dissolve, and then swallow. Do not cut, crush, or chew this medicine. You do not need water to take this medicine. Talk to your pediatrician about the use of this medicine in children. Special care may be needed. Overdosage: If you think you have taken too much of this medicine contact a poison control center or emergency room at once. NOTE: This medicine is only for you. Do not share this medicine with others. What if I miss a dose? This does not apply. This medicine is not for regular use. What may interact with this medication? This medicine may interact with the following medications: certain medicines for fungal infections like fluconazole, itraconazole rifampin This list may not describe all possible interactions. Give your health care provider a list of all the medicines, herbs, non-prescription drugs, or dietary supplements you use. Also tell them if you smoke, drink alcohol,  or use illegal drugs. Some items may interact with your medicine. What should I watch for while using this medication? Visit your health care professional for regular checks on your progress. Tell your health care professional if your symptoms do not start to get better or if they get worse. What side effects may I notice from receiving this  medication? Side effects that you should report to your doctor or health care professional as soon as possible: allergic reactions like skin rash, itching or hives; swelling of the face, lips, or tongue Side effects that usually do not require medical attention (report these to your doctor or health care professional if they continue or are bothersome): nausea This list may not describe all possible side effects. Call your doctor for medical advice about side effects. You may report side effects to FDA at 1-800-FDA-1088. Where should I keep my medication? Keep out of the reach of children and pets. Store at room temperature between 20 and 25 degrees C (68 and 77 degrees F). Get rid of any unused medicine after the expiration date. To get rid of medicines that are no longer needed or have expired: Take the medicine to a medicine take-back program. Check with your pharmacy or law enforcement to find a location. If you cannot return the medicine, check the label or package insert to see if the medicine should be thrown out in the garbage or flushed down the toilet. If you are not sure, ask your health care provider. If it is safe to put it in the trash, take the medicine out of the container. Mix the medicine with cat litter, dirt, coffee grounds, or other unwanted substance. Seal the mixture in a bag or container. Put it in the trash. NOTE: This sheet is a summary. It may not cover all possible information. If you have questions about this medicine, talk to your doctor, pharmacist, or health care provider.  2022 Elsevier/Gold Standard (2019-06-13 00:00:00) Ondansetron Dissolving Tablets What is this medication? ONDANSETRON (on DAN se tron) prevents nausea and vomiting from chemotherapy, radiation, or surgery. It works by blocking substances in the body that may cause nausea or vomiting. It belongs to a group of medications called antiemetics. This medicine may be used for other purposes; ask your  health care provider or pharmacist if you have questions. COMMON BRAND NAME(S): Zofran ODT What should I tell my care team before I take this medication? They need to know if you have any of these conditions: Heart disease History of irregular heartbeat Liver disease Low levels of magnesium or potassium in the blood An unusual or allergic reaction to ondansetron, granisetron, other medications, foods, dyes, or preservatives Pregnant or trying to get pregnant Breast-feeding How should I use this medication? These tablets are made to dissolve in the mouth. Do not try to push the tablet through the foil backing. With dry hands, peel away the foil backing and gently remove the tablet. Place the tablet in the mouth and allow it to dissolve, then swallow. While you may take these tablets with water, it is not necessary to do so. Talk to your care team regarding the use of this medication in children. Special care may be needed. Overdosage: If you think you have taken too much of this medicine contact a poison control center or emergency room at once. NOTE: This medicine is only for you. Do not share this medicine with others. What if I miss a dose? If you miss a dose, take it as soon  as you can. If it is almost time for your next dose, take only that dose. Do not take double or extra doses. What may interact with this medication? Do not take this medication with any of the following: Apomorphine Certain medications for fungal infections like fluconazole, itraconazole, ketoconazole, posaconazole, voriconazole Cisapride Dronedarone Pimozide Thioridazine This medication may also interact with the following: Carbamazepine Certain medications for depression, anxiety, or psychotic disturbances Fentanyl Linezolid MAOIs like Carbex, Eldepryl, Marplan, Nardil, and Parnate Methylene blue (injected into a vein) Other medications that prolong the QT interval (cause an abnormal heart rhythm) like  dofetilide, ziprasidone Phenytoin Rifampicin Tramadol This list may not describe all possible interactions. Give your health care provider a list of all the medicines, herbs, non-prescription drugs, or dietary supplements you use. Also tell them if you smoke, drink alcohol, or use illegal drugs. Some items may interact with your medicine. What should I watch for while using this medication? Check with your care team as soon as you can if you have any sign of an allergic reaction. What side effects may I notice from receiving this medication? Side effects that you should report to your care team as soon as possible: Allergic reactions--skin rash, itching, hives, swelling of the face, lips, tongue, or throat Bowel blockage--stomach cramping, unable to have a bowel movement or pass gas, loss of appetite, vomiting Chest pain (angina)--pain, pressure, or tightness in the chest, neck, back, or arms Heart rhythm changes--fast or irregular heartbeat, dizziness, feeling faint or lightheaded, chest pain, trouble breathing Irritability, confusion, fast or irregular heartbeat, muscle stiffness, twitching muscles, sweating, high fever, seizure, chills, vomiting, diarrhea, which may be signs of serotonin syndrome Side effects that usually do not require medical attention (report to your care team if they continue or are bothersome): Constipation Diarrhea General discomfort and fatigue Headache This list may not describe all possible side effects. Call your doctor for medical advice about side effects. You may report side effects to FDA at 1-800-FDA-1088. Where should I keep my medication? Keep out of the reach of children and pets. Store between 2 and 30 degrees C (36 and 86 degrees F). Throw away any unused medication after the expiration date. NOTE: This sheet is a summary. It may not cover all possible information. If you have questions about this medicine, talk to your doctor, pharmacist, or health care  provider.  2022 Elsevier/Gold Standard (2020-02-02 00:00:00) Rizatriptan Disintegrating Tablets What is this medication? RIZATRIPTAN (rye za TRIP tan) treats migraines. It works by blocking pain signals and narrowing blood vessels in the brain. It belongs to a group of medications called triptans. It is not used to prevent migraines. This medicine may be used for other purposes; ask your health care provider or pharmacist if you have questions. COMMON BRAND NAME(S): Maxalt-MLT What should I tell my care team before I take this medication? They need to know if you have any of these conditions: Cigarette smoker Circulation problems in fingers and toes Diabetes Heart disease High blood pressure High cholesterol History of irregular heartbeat History of stroke Kidney disease Liver disease Stomach or intestine problems An unusual or allergic reaction to rizatriptan, other medications, foods, dyes, or preservatives Pregnant or trying to get pregnant Breast-feeding How should I use this medication? Take this medication by mouth. Follow the directions on the prescription label. Leave the tablet in the sealed blister pack until you are ready to take it. With dry hands, open the blister and gently remove the tablet. If the tablet breaks  or crumbles, throw it away and take a new tablet out of the blister pack. Place the tablet in the mouth and allow it to dissolve, and then swallow. Do not cut, crush, or chew this medication. You do not need water to take this medication. Do not take it more often than directed. Talk to your care team regarding the use of this medication in children. While this medication may be prescribed for children as young as 6 years for selected conditions, precautions do apply. Overdosage: If you think you have taken too much of this medicine contact a poison control center or emergency room at once. NOTE: This medicine is only for you. Do not share this medicine with  others. What if I miss a dose? This does not apply. This medication is not for regular use. What may interact with this medication? Do not take this medication with any of the following medications: Certain medications for migraine headache like almotriptan, eletriptan, frovatriptan, naratriptan, rizatriptan, sumatriptan, zolmitriptan Ergot alkaloids like dihydroergotamine, ergonovine, ergotamine, methylergonovine MAOIs like Carbex, Eldepryl, Marplan, Nardil, and Parnate This medication may also interact with the following medications: Certain medications for depression, anxiety, or psychotic disorders Propranolol This list may not describe all possible interactions. Give your health care provider a list of all the medicines, herbs, non-prescription drugs, or dietary supplements you use. Also tell them if you smoke, drink alcohol, or use illegal drugs. Some items may interact with your medicine. What should I watch for while using this medication? Visit your care team for regular checks on your progress. Tell your care team if your symptoms do not start to get better or if they get worse. You may get drowsy or dizzy. Do not drive, use machinery, or do anything that needs mental alertness until you know how this medication affects you. Do not stand up or sit up quickly, especially if you are an older patient. This reduces the risk of dizzy or fainting spells. Alcohol may interfere with the effect of this medication. Your mouth may get dry. Chewing sugarless gum or sucking hard candy and drinking plenty of water may help. Contact your care team if the problem does not go away or is severe. If you take migraine medications for 10 or more days a month, your migraines may get worse. Keep a diary of headache days and medication use. Contact your care team if your migraine attacks occur more frequently. What side effects may I notice from receiving this medication? Side effects that you should report to  your care team as soon as possible: Allergic reactions--skin rash, itching, hives, swelling of the face, lips, tongue, or throat Burning, pain, tingling, or color changes in the legs or feet Heart attack--pain or tightness in the chest, shoulders, arms, or jaw, nausea, shortness of breath, cold or clammy skin, feeling faint or lightheaded Heart rhythm changes--fast or irregular heartbeat, dizziness, feeling faint or lightheaded, chest pain, trouble breathing Increase in blood pressure Irritability, confusion, fast or irregular heartbeat, muscle stiffness, twitching muscles, sweating, high fever, seizure, chills, vomiting, diarrhea, which may be signs of serotonin syndrome Raynaud's--cool, numb, or painful fingers or toes that may change color from pale, to blue, to red Seizures Stroke--sudden numbness or weakness of the face, arm, or leg, trouble speaking, confusion, trouble walking, loss of balance or coordination, dizziness, severe headache, change in vision Sudden or severe stomach pain, nausea, vomiting, fever, or bloody diarrhea Vision loss Side effects that usually do not require medical attention (report to your care  team if they continue or are bothersome): Dizziness General discomfort or fatigue This list may not describe all possible side effects. Call your doctor for medical advice about side effects. You may report side effects to FDA at 1-800-FDA-1088. Where should I keep my medication? Keep out of the reach of children and pets. Store at room temperature between 15 and 30 degrees C (59 and 86 degrees F). Protect from light and moisture. Throw away any unused medication after the expiration date. NOTE: This sheet is a summary. It may not cover all possible information. If you have questions about this medicine, talk to your doctor, pharmacist, or health care provider.  2022 Elsevier/Gold Standard (2020-02-07 00:00:00)  Sleep Apnea Sleep apnea is a condition in which breathing  pauses or becomes shallow during sleep. People with sleep apnea usually snore loudly. They may have times when they gasp and stop breathing for 10 seconds or more during sleep. This may happen many times during the night. Sleep apnea disrupts your sleep and keeps your body from getting the rest that it needs. This condition can increase your risk of certain health problems, including: Heart attack. Stroke. Obesity. Type 2 diabetes. Heart failure. Irregular heartbeat. High blood pressure. The goal of treatment is to help you breathe normally again. What are the causes? The most common cause of sleep apnea is a collapsed or blocked airway. There are three kinds of sleep apnea: Obstructive sleep apnea. This kind is caused by a blocked or collapsed airway. Central sleep apnea. This kind happens when the part of the brain that controls breathing does not send the correct signals to the muscles that control breathing. Mixed sleep apnea. This is a combination of obstructive and central sleep apnea. What increases the risk? You are more likely to develop this condition if you: Are overweight. Smoke. Have a smaller than normal airway. Are older. Are female. Drink alcohol. Take sedatives or tranquilizers. Have a family history of sleep apnea. Have a tongue or tonsils that are larger than normal. What are the signs or symptoms? Symptoms of this condition include: Trouble staying asleep. Loud snoring. Morning headaches. Waking up gasping. Dry mouth or sore throat in the morning. Daytime sleepiness and tiredness. If you have daytime fatigue because of sleep apnea, you may be more likely to have: Trouble concentrating. Forgetfulness. Irritability or mood swings. Personality changes. Feelings of depression. Sexual dysfunction. This may include loss of interest if you are female, or erectile dysfunction if you are female. How is this diagnosed? This condition may be diagnosed with: A medical  history. A physical exam. A series of tests that are done while you are sleeping (sleep study). These tests are usually done in a sleep lab, but they may also be done at home. How is this treated? Treatment for this condition aims to restore normal breathing and to ease symptoms during sleep. It may involve managing health issues that can affect breathing, such as high blood pressure or obesity. Treatment may include: Sleeping on your side. Using a decongestant if you have nasal congestion. Avoiding the use of depressants, including alcohol, sedatives, and narcotics. Losing weight if you are overweight. Making changes to your diet. Quitting smoking. Using a device to open your airway while you sleep, such as: An oral appliance. This is a custom-made mouthpiece that shifts your lower jaw forward. A continuous positive airway pressure (CPAP) device. This device blows air through a mask when you breathe out (exhale). A nasal expiratory positive airway pressure (EPAP)  device. This device has valves that you put into each nostril. A bi-level positive airway pressure (BIPAP) device. This device blows air through a mask when you breathe in (inhale) and breathe out (exhale). Having surgery if other treatments do not work. During surgery, excess tissue is removed to create a wider airway. Follow these instructions at home: Lifestyle Make any lifestyle changes that your health care provider recommends. Eat a healthy, well-balanced diet. Take steps to lose weight if you are overweight. Avoid using depressants, including alcohol, sedatives, and narcotics. Do not use any products that contain nicotine or tobacco. These products include cigarettes, chewing tobacco, and vaping devices, such as e-cigarettes. If you need help quitting, ask your health care provider. General instructions Take over-the-counter and prescription medicines only as told by your health care provider. If you were given a device to  open your airway while you sleep, use it only as told by your health care provider. If you are having surgery, make sure to tell your health care provider you have sleep apnea. You may need to bring your device with you. Keep all follow-up visits. This is important. Contact a health care provider if: The device that you received to open your airway during sleep is uncomfortable or does not seem to be working. Your symptoms do not improve. Your symptoms get worse. Get help right away if: You develop: Chest pain. Shortness of breath. Discomfort in your back, arms, or stomach. You have: Trouble speaking. Weakness on one side of your body. Drooping in your face. These symptoms may represent a serious problem that is an emergency. Do not wait to see if the symptoms will go away. Get medical help right away. Call your local emergency services (911 in the [place] Do not drive yourself to the hospital. Summary Sleep apnea is a condition in which breathing pauses or becomes shallow during sleep. The most common cause is a collapsed or blocked airway. The goal of treatment is to restore normal breathing and to ease symptoms during sleep. This information is not intended to replace advice given to you by your health care provider. Make sure you discuss any questions you have with your health care provider. Document Revised: 08/07/2020 Document Reviewed: 12/08/2019 Elsevier Patient Education  2022 [place]. Migraine Headache A migraine headache is an intense, throbbing pain on one side or both sides of the head. Migraine headaches may also cause other symptoms, such as nausea, vomiting, and sensitivity to light and noise. A migraine headache can last from 4 hours to 3 days. Talk with your doctor about what things may bring on (trigger) your migraine headaches. What are the causes? The exact cause of this condition is not known. However, a migraine may be caused when nerves in the brain become  irritated and release chemicals that cause inflammation of blood vessels. This inflammation causes pain. This condition may be triggered or caused by: Drinking alcohol. Smoking. Taking medicines, such as: Medicine used to treat chest pain (nitroglycerin). Birth control pills. Estrogen. Certain blood pressure medicines. Eating or drinking products that contain nitrates, glutamate, aspartame, or tyramine. Aged cheeses, chocolate, or caffeine may also be triggers. Doing physical activity. Other things that may trigger a migraine headache include: Menstruation. Pregnancy. Hunger. Stress. Lack of sleep or too much sleep. Weather changes. Fatigue. What increases the risk? The following factors may make you more likely to experience migraine headaches: Being a certain age. This condition is more common in people who are 59-94 years old. Being female.  Having a family history of migraine headaches. Being Caucasian. Having a mental health condition, such as depression or anxiety. Being obese. What are the signs or symptoms? The main symptom of this condition is pulsating or throbbing pain. This pain may: Happen in any area of the head, such as on one side or both sides. Interfere with daily activities. Get worse with physical activity. Get worse with exposure to bright lights or loud noises. Other symptoms may include: Nausea. Vomiting. Dizziness. General sensitivity to bright lights, loud noises, or smells. Before you get a migraine headache, you may get warning signs (an aura). An aura may include: Seeing flashing lights or having blind spots. Seeing bright spots, halos, or zigzag lines. Having tunnel vision or blurred vision. Having numbness or a tingling feeling. Having trouble talking. Having muscle weakness. Some people have symptoms after a migraine headache (postdromal phase), such as: Feeling tired. Difficulty concentrating. How is this diagnosed? A migraine headache  can be diagnosed based on: Your symptoms. A physical exam. Tests, such as: CT scan or an MRI of the head. These imaging tests can help rule out other causes of headaches. Taking fluid from the spine (lumbar puncture) and analyzing it (cerebrospinal fluid analysis, or CSF analysis). How is this treated? This condition may be treated with medicines that: Relieve pain. Relieve nausea. Prevent migraine headaches. Treatment for this condition may also include: Acupuncture. Lifestyle changes like avoiding foods that trigger migraine headaches. Biofeedback. Cognitive behavioral therapy. Follow these instructions at home: Medicines Take over-the-counter and prescription medicines only as told by your health care provider. Ask your health care provider if the medicine prescribed to you: Requires you to avoid driving or using heavy machinery. Can cause constipation. You may need to take these actions to prevent or treat constipation: Drink enough fluid to keep your urine pale yellow. Take over-the-counter or prescription medicines. Eat foods that are high in fiber, such as beans, whole grains, and fresh fruits and vegetables. Limit foods that are high in fat and processed sugars, such as fried or sweet foods. Lifestyle Do not drink alcohol. Do not use any products that contain nicotine or tobacco, such as cigarettes, e-cigarettes, and chewing tobacco. If you need help quitting, ask your health care provider. Get at least 8 hours of sleep every night. Find ways to manage stress, such as meditation, deep breathing, or yoga. General instructions   Keep a journal to find out what may trigger your migraine headaches. For example, write down: What you eat and drink. How much sleep you get. Any change to your diet or medicines. If you have a migraine headache: Avoid things that make your symptoms worse, such as bright lights. It may help to lie down in a dark, quiet room. Do not drive or use  heavy machinery. Ask your health care provider what activities are safe for you while you are experiencing symptoms. Keep all follow-up visits as told by your health care provider. This is important. Contact a health care provider if: You develop symptoms that are different or more severe than your usual migraine headache symptoms. You have more than 15 headache days in one month. Get help right away if: Your migraine headache becomes severe. Your migraine headache lasts longer than 72 hours. You have a fever. You have a stiff neck. You have vision loss. Your muscles feel weak or like you cannot control them. You start to lose your balance often. You have trouble walking. You faint. You have a seizure. Summary A  migraine headache is an intense, throbbing pain on one side or both sides of the head. Migraines may also cause other symptoms, such as nausea, vomiting, and sensitivity to light and noise. This condition may be treated with medicines and lifestyle changes. You may also need to avoid certain things that trigger a migraine headache. Keep a journal to find out what may trigger your migraine headaches. Contact your health care provider if you have more than 15 headache days in a month or you develop symptoms that are different or more severe than your usual migraine headache symptoms. This information is not intended to replace advice given to you by your health care provider. Make sure you discuss any questions you have with your health care provider. Document Revised: 04/22/2018 Document Reviewed: 02/10/2018 Elsevier Patient Education  [place].

## 2020-11-21 NOTE — Progress Notes (Signed)
[facility]    Provider:  Dr Jaynee Eagles Requesting Provider: Crist Infante, MD Primary Care Provider:  Crist Infante, MD  CC:  migraines  HPI:  Meredith Blackwell is a 55 y.o. female here as requested by Crist Infante, MD for migraines since the 4th grade. PMHx anemia, asthma, environmental and seasonal allergies, GERD, glaucoma, breast lumpectomy, back surgery, never smoked, appendectomy, iron deficiency, hyperlipidemia, depression, tinnitus, acute maxillary sinusitis, prediabetes. Her daughters Alanson Puls and Overton Mam are patients here for migraines as well.   She has daily headaches, twice monthly migraines. Started worsening earlier this year and would last for days. Started in 4th grade. Migraines on the left side of her face, nausea, she can have numbness in the face before migraines rarely, throbbing,light sensitivity, they can make her feel very badly, movement makes it worse, can also happen on the right side in fact she has had a headache on the right for 3 days now, behind the eye and radiating, can be the whole head, wakes up with headaches, she is tired but doesn't sleep well (consider a sleep study), she was taking a lot of advil and stopped doing that, no aura often, she doesn't remember a time not having headaches. Daily headaches for years. 8 moderately to severe migraine days a month that last up to 24 hours. Worsening. Has vision issues, floaters and blurry vision, sitting in a dark room helps, she doesn;t want to get out of bed some days. No other focal neurologic deficits, associated symptoms, inciting events or modifiable factors.  Reviewed notes, labs and imaging from outside physicians, which showed:  Per Dr. Tempie Donning notes, she sees Dr. Maxie Better and Ortho, Dr. Dewitt Rota, Dr. Midge Aver eye, Dr. Tressia Danas GYN, Dr. Ishmael Holter allergy, Dr. Clydene Laming dermatology, Dr. Alethia Berthold optometry, Dr. Brantley Stage and surgery.  She is on sumatriptan hand.  Patient saw ophthalmology in 2014, for  glaucoma evaluation and possibly complications from asthma meds, history of narrow angle glaucoma, uncontrolled asthma, has tried Singulair Zyrtec ProAir Symbicort and Nasonex. Amitriptyline, maxalt, aimovig contraindicated due to constipation  Labs reviewed from June 12, 2020 included unremarkable CMP with BUN 16 and creatinine 0.6, CBC from Jun 04, 2020 was normal TSH also from Jun 04, 2020 was normal, hemoglobin A1c in Jun 04, 2020 was 6.3.  I reviewed Dr. Tempie Donning examination which was normal including eyes, ears, neck, skin, heart, lungs, abdomen.  From a thorough review of records, medications tried that can be used in migraine and headache management include: Ibuprofen, Tylenol, gabapentin, melatonin, Imitrex, propranolol contraindicated due to asthma, Topamax contraindicated due to glaucoma, at the time her exam was 20/20 right and 20/20 left, pressure was 20 in the right and 20 in the left, pupils were equally round and reactive to light, visual fields were full, extraocular movements were full, slit-lamp and funduscopic exam were normal, diagnosed with open angle with borderline findings, bilateral, low risk angle-closure, all meds should be okay, okay to take needed asthma medication.  But continue observation.  Review of Systems: Patient complains of symptoms per HPI as well as the following symptoms constipation, hot flashes. Pertinent negatives and positives per HPI. All others negative.   Social History   Socioeconomic History   Marital status: Married    Spouse name: Not on file   Number of children: Not on file   Years of education: Not on file   Highest education level: Not on file  Occupational History   Not on file  Tobacco Use   Smoking  status: Never   Smokeless tobacco: Never  Substance and Sexual Activity   Alcohol use: Yes    Comment: 3 glasses of wine a week.   Drug use: No   Sexual activity: Not on file  Other Topics Concern   Not on file  Social History  Narrative   Coffe decaf one cup daily.  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     Social Determinants of Health   Financial Resource Strain: Not on file  Food Insecurity: Not on file  Transportation Needs: Not on file  Physical Activity: Not on file  Stress: Not on file  Social Connections: Not on file  Intimate Partner Violence: Not on file    Family History  Problem Relation Age of Onset   Breast cancer Sister    Cancer Sister    Migraines Sister    Migraines Sister    Breast cancer Maternal Aunt    Breast cancer Cousin    Diabetes Other    Hyperlipidemia Other    Stroke Other    Cancer Other    Asthma Other    Colon cancer Neg Hx    Esophageal cancer Neg Hx    Rectal cancer Neg Hx    Stomach cancer Neg Hx     Past Medical History:  Diagnosis Date   Abdominal pain    Anal spasm    Anemia    Asthma    Constipation    Diarrhea    Dysphagia    Environmental and seasonal allergies    Family history of bladder cancer    Family history of breast cancer    Family history of melanoma    Family history of ovarian cancer    GERD (gastroesophageal reflux disease)    Glaucoma    Hemorrhoid    Nausea     Patient Active Problem List   Diagnosis Date Noted   Chronic migraine without aura, with intractable migraine, so stated, with status migrainosus 11/21/2020   Migraine with aura and without status migrainosus, not intractable 11/21/2020   Lumbar radiculopathy 11/21/2020   Hot flashes due to menopause 11/21/2020   Genetic testing 07/30/2020   Family history of breast cancer 07/08/2020   Family history of ovarian cancer 07/08/2020   Family history of bladder cancer 07/08/2020   Family history of melanoma 07/08/2020    Past Surgical History:  Procedure Laterality Date   ABDOMINAL HYSTERECTOMY     pt says hysterectomy was vaginal   APPENDECTOMY     BACK SURGERY     BREAST EXCISIONAL BIOPSY     BREAST LUMPECTOMY     NOSE  SURGERY     SHOULDER SURGERY     TONSILLECTOMY     WISDOM TOOTH EXTRACTION      Current Outpatient Medications  Medication Sig Dispense Refill   acetaminophen (TYLENOL) 500 MG tablet Take 500 mg by mouth every 6 (six) hours as needed.     albuterol (PROVENTIL HFA;VENTOLIN HFA) 108 (90 Base) MCG/ACT inhaler Inhale 2 puffs into the lungs every 6 (six) hours as needed for wheezing or shortness of breath.     budesonide-formoterol (SYMBICORT) 160-4.5 MCG/ACT inhaler Inhale 2 puffs into the lungs 2 (two) times daily.     cetirizine (ZYRTEC) 10 MG tablet Take 10 mg by mouth daily.     famotidine (PEPCID) 20 MG tablet Take 20 mg by mouth 2 (two) times daily.     Ferrous Sulfate (  IRON) 325 (65 Fe) MG TABS Take by mouth.     fluticasone (FLONASE) 50 MCG/ACT nasal spray Place into both nostrils daily.     gabapentin (NEURONTIN) 300 MG capsule Take 1 capsule (300 mg total) by mouth at bedtime. 90 capsule 1   Galcanezumab-gnlm (EMGALITY) 120 MG/ML SOAJ Inject 120 mg into the skin every 30 (thirty) days. 1.12 mL 11   Galcanezumab-gnlm (EMGALITY) 120 MG/ML SOAJ Inject 120 mg into the skin every 30 (thirty) days. 1.12 mL 11   Melatonin 3 MG TABS Take by mouth.     montelukast (SINGULAIR) 10 MG tablet Take 10 mg by mouth at bedtime.     ondansetron (ZOFRAN-ODT) 4 MG disintegrating tablet Take 1-2 tablets (4-8 mg total) by mouth every 8 (eight) hours as needed for nausea. May also take with maxalt for migraine 30 tablet 3   pantoprazole (PROTONIX) 40 MG tablet pantoprazole 40 mg tablet,delayed release  TAKE 1 TABLET BY MOUTH EVERY DAY     pseudoephedrine (SUDAFED) 30 MG tablet Take 30 mg by mouth every 4 (four) hours as needed for congestion.     raloxifene (EVISTA) 60 MG tablet raloxifene 60 mg tablet  TAKE 1 TABLET BY MOUTH ONCE DAILY     ranitidine (ZANTAC) 150 MG capsule Take 150 mg by mouth 2 (two) times daily.     Rimegepant Sulfate (NURTEC) 75 MG TBDP Take 75 mg by mouth daily as needed. For  migraines. Take as close to onset of migraine as possible. One daily maximum. 4 tablet 0   rizatriptan (MAXALT-MLT) 10 MG disintegrating tablet Take 1 tablet (10 mg total) by mouth as needed for migraine. May repeat in 2 hours if needed 9 tablet 11   vitamin C (ASCORBIC ACID) 500 MG tablet Take 500 mg by mouth daily.     Current Facility-Administered Medications  Medication Dose Route Frequency Provider Last Rate Last Admin   0.9 %  sodium chloride infusion  500 mL Intravenous Continuous Nandigam, Venia Minks, MD       Galcanezumab-gnlm SOAJ 240 mg  240 mg Subcutaneous Once Melvenia Beam, MD        Allergies as of 11/21/2020 - Review Complete 11/21/2020  Allergen Reaction Noted   Doxycycline  12/24/2015   Septra [sulfamethoxazole-trimethoprim]  12/24/2015    Vitals: BP 121/76   Pulse 78   Ht 5\' 7"  (1.702 m)   Wt 178 lb (80.7 kg)   BMI 27.88 kg/m  Last Weight:  Wt Readings from Last 1 Encounters:  11/21/20 178 lb (80.7 kg)   Last Height:   Ht Readings from Last 1 Encounters:  11/21/20 5\' 7"  (1.702 m)     Physical exam: Exam: Gen: NAD, conversant, well nourised, well groomed                     CV: RRR, no MRG. No Carotid Bruits. No peripheral edema, warm, nontender Eyes: Conjunctivae clear without exudates or hemorrhage  Neuro: Detailed Neurologic Exam  Speech:    Speech is normal; fluent and spontaneous with normal comprehension.  Cognition:    The patient is oriented to person, place, and time;     recent and remote memory intact;     language fluent;     normal attention, concentration,     fund of knowledge Cranial Nerves:    The pupils are equal, round, and reactive to light. The fundi are flat. Visual fields are full to finger confrontation. Extraocular movements are intact. Trigeminal  sensation is intact and the muscles of mastication are normal. The face is symmetric. The palate elevates in the midline. Hearing intact. Voice is normal. Shoulder shrug is  normal. The tongue has normal motion without fasciculations.   Coordination:    Normal   Gait:    normal.   Motor Observation:    No asymmetry, no atrophy, and no involuntary movements noted. Tone:    Normal muscle tone.    Posture:    Posture is normal. normal erect    Strength:    Strength is V/V in the upper and lower limbs.      Sensation: intact to LT     Reflex Exam:  DTR's:    Deep tendon reflexes in the upper and lower extremities are normal bilaterally.   Toes:    The toes are downgoing bilaterally.   Clonus:    Clonus is absent.    Assessment/Plan: Patient with chronic migraines, daily headaches.   Prevention: Emgality. If this does not work try Insurance risk surveyor (daughter did better on Ajovy but Emgality is preferred by her insurance)  - wakes up with headaches, she is tired but doesn't sleep well (consider a sleep study, at this time she declines). Gabapentin may help with radiculopathy and hot flashes, she already takes it sometimes, I can refill it, take it at bedtime may help.  - Migraine/headache prevention: Start Emgality. 2 injections today and then one injection monthly. F/u  3 months to see if helping, can mychart message me in the meantime - Acute/emergent: Try Rizatriptan: Please take one tablet at the onset of your headache. If it does not improve the symptoms please take one additional tablet. Do not take more then 2 tablets in 24hrs. Do not take use more then 2 to 3 times in a week. - Acute/Emergent: Try Nurtec - Ondansetron: May take for nause motion sickness - Nurtec, maxalt and Ondansetron may be taken together for severe migraine/headache - MRI brain: MRI brain due to concerning symptoms of morning headaches, positional headaches,vision changes  to look for space occupying mass, chiari or intracranial hypertension (pseudotumor). - Consider sleep study  Discussed: To prevent or relieve headaches, try the following: Cool Compress. Lie down and place a cool  compress on your head.  Avoid headache triggers. If certain foods or odors seem to have triggered your migraines in the past, avoid them. A headache diary might help you identify triggers.  Include physical activity in your daily routine. Try a daily walk or other moderate aerobic exercise.  Manage stress. Find healthy ways to cope with the stressors, such as delegating tasks on your to-do list.  Practice relaxation techniques. Try deep breathing, yoga, massage and visualization.  Eat regularly. Eating regularly scheduled meals and maintaining a healthy diet might help prevent headaches. Also, drink plenty of fluids.  Follow a regular sleep schedule. Sleep deprivation might contribute to headaches Consider biofeedback. With this mind-body technique, you learn to control certain bodily functions -- such as muscle tension, heart rate and blood pressure -- to prevent headaches or reduce headache pain.    Proceed to emergency room if you experience new or worsening symptoms or symptoms do not resolve, if you have new neurologic symptoms or if headache is severe, or for any concerning symptom.   Provided education and documentation from American headache Society toolbox including articles on: chronic migraine medication overuse headache, chronic migraines, prevention of migraines, behavioral and other nonpharmacologic treatments for headache.  Discussed:  There is increased risk for  stroke in women with migraine with aura and a contraindication for the combined contraceptive pill for use by women who have migraine with aura. The risk for women with migraine without aura is lower. However other risk factors like smoking are far more likely to increase stroke risk than migraine. There is a recommendation for no smoking and for the use of OCPs without estrogen such as progestogen only pills particularly for women with migraine with aura.Marland Kitchen People who have migraine headaches with auras may be 3 times more  likely to have a stroke caused by a blood clot, compared to migraine patients who don't see auras. Women who take hormone-replacement therapy may be 30 percent more likely to suffer a clot-based stroke than women not taking medication containing estrogen. Other risk factors like smoking and high blood pressure may be  much more important.    Orders Placed This Encounter  Procedures   MR BRAIN W WO CONTRAST    Meds ordered this encounter  Medications   rizatriptan (MAXALT-MLT) 10 MG disintegrating tablet    Sig: Take 1 tablet (10 mg total) by mouth as needed for migraine. May repeat in 2 hours if needed    Dispense:  9 tablet    Refill:  11   Galcanezumab-gnlm (EMGALITY) 120 MG/ML SOAJ    Sig: Inject 120 mg into the skin every 30 (thirty) days.    Dispense:  1.12 mL    Refill:  11   ondansetron (ZOFRAN-ODT) 4 MG disintegrating tablet    Sig: Take 1-2 tablets (4-8 mg total) by mouth every 8 (eight) hours as needed for nausea. May also take with maxalt for migraine    Dispense:  30 tablet    Refill:  3   Galcanezumab-gnlm SOAJ 240 mg    [id number] U 07-08-22 2 boxes samples, injected one of these boxes today   Galcanezumab-gnlm (EMGALITY) 120 MG/ML SOAJ    Sig: Inject 120 mg into the skin every 30 (thirty) days.    Dispense:  1.12 mL    Refill:  11   gabapentin (NEURONTIN) 300 MG capsule    Sig: Take 1 capsule (300 mg total) by mouth at bedtime.    Dispense:  90 capsule    Refill:  1   Rimegepant Sulfate (NURTEC) 75 MG TBDP    Sig: Take 75 mg by mouth daily as needed. For migraines. Take as close to onset of migraine as possible. One daily maximum.    Dispense:  4 tablet    Refill:  0     Cc: Crist Infante, MD,  Crist Infante, MD  Sarina Ill, MD  [facility] [address] [place], [place] [address]  Phone [phone] Fax [phone]

## 2020-11-28 ENCOUNTER — Telehealth: Payer: Self-pay | Admitting: Neurology

## 2020-11-28 NOTE — Telephone Encounter (Signed)
MR Brain w/wo contrast Dr. Ihor Dow Josem Kaufmann: [id number] (exp. 11/27/20 to 01/25/21). Patient is scheduled at [facility] for 12/24/20.

## 2020-12-04 MED ORDER — EMGALITY 120 MG/ML ~~LOC~~ SOAJ: SUBCUTANEOUS | 0 refills | Status: DC

## 2020-12-04 NOTE — Progress Notes (Signed)
Injected 240mg  into skin when in for appt (loading dose) used Sample: , exp jun 26-2024.

## 2020-12-04 NOTE — Progress Notes (Signed)
Entered this note by mistake ERROR.

## 2020-12-04 NOTE — Addendum Note (Signed)
Addended by: Brandon Melnick on: 12/04/2020 04:59 PM   Modules accepted: Orders

## 2020-12-12 DIAGNOSIS — M8589 Other specified disorders of bone density and structure, multiple sites: Secondary | ICD-10-CM | POA: Diagnosis not present

## 2020-12-17 ENCOUNTER — Encounter: Payer: Self-pay | Admitting: *Deleted

## 2020-12-24 ENCOUNTER — Ambulatory Visit: Payer: BC Managed Care – PPO

## 2020-12-24 DIAGNOSIS — R51 Headache with orthostatic component, not elsewhere classified: Secondary | ICD-10-CM | POA: Diagnosis not present

## 2020-12-24 DIAGNOSIS — R519 Headache, unspecified: Secondary | ICD-10-CM

## 2020-12-24 DIAGNOSIS — H539 Unspecified visual disturbance: Secondary | ICD-10-CM | POA: Diagnosis not present

## 2020-12-24 MED ORDER — GADOBENATE DIMEGLUMINE 529 MG/ML IV SOLN
15.0000 mL | Freq: Once | INTRAVENOUS | Status: AC | PRN
  Administered 2020-12-24: 15 mL via INTRAVENOUS

## 2020-12-25 ENCOUNTER — Ambulatory Visit
Admission: RE | Admit: 2020-12-25 | Discharge: 2020-12-25 | Disposition: A | Discharge status: Home / Self Care | Payer: No Typology Code available for payment source | Source: Ambulatory Visit | Attending: Internal Medicine | Admitting: Internal Medicine

## 2020-12-25 DIAGNOSIS — Z1231 Encounter for screening mammogram for malignant neoplasm of breast: Secondary | ICD-10-CM

## 2020-12-30 ENCOUNTER — Telehealth: Payer: Self-pay | Admitting: *Deleted

## 2020-12-30 NOTE — Telephone Encounter (Signed)
Spoke with [facility] to obtain pt's pharmacy benefit information.  BIN: [id number] PCN: [id number] [id number] ID: [id number]

## 2020-12-30 NOTE — Telephone Encounter (Signed)
Completed Emgality PA on Cover My Meds. Key: [id number]. Awaiting determination from Cover My Meds.

## 2020-12-31 NOTE — Telephone Encounter (Signed)
Received a fax yes [employer] stating that Emgality has been from 01/01/2019 until 03/31/2021.  Faxed letter to pharmacy. Received a receipt of confirmation. Notified patient.

## 2021-02-24 ENCOUNTER — Encounter: Payer: Self-pay | Admitting: Neurology

## 2021-02-24 ENCOUNTER — Telehealth (INDEPENDENT_AMBULATORY_CARE_PROVIDER_SITE_OTHER): Payer: BC Managed Care – PPO | Admitting: Neurology

## 2021-02-24 DIAGNOSIS — G43009 Migraine without aura, not intractable, without status migrainosus: Secondary | ICD-10-CM | POA: Diagnosis not present

## 2021-02-24 DIAGNOSIS — G43711 Chronic migraine without aura, intractable, with status migrainosus: Secondary | ICD-10-CM | POA: Diagnosis not present

## 2021-02-24 DIAGNOSIS — G43109 Migraine with aura, not intractable, without status migrainosus: Secondary | ICD-10-CM | POA: Diagnosis not present

## 2021-02-24 MED ORDER — NURTEC 75 MG PO TBDP: 75.0000 mg | ORAL_TABLET | Freq: Every day | ORAL | 11 refills | Status: DC | PRN

## 2021-02-24 MED ORDER — EMGALITY 120 MG/ML ~~LOC~~ SOAJ: 120.0000 mg | SUBCUTANEOUS | 11 refills | Status: DC

## 2021-02-24 NOTE — Progress Notes (Signed)
[facility]    Provider:  Dr Jaynee Eagles Requesting Provider: Crist Infante, MD Primary Care Provider:  Crist Infante, MD  CC:  migraines Virtual Visit via Video Note  I connected with Meredith Blackwell on 02/24/21 at  3:30 PM EST by a video enabled telemedicine application and verified that I am speaking with the correct person using two identifiers.  Location: Patient: home Provider: office   I discussed the limitations of evaluation and management by telemedicine and the availability of in person appointments. The patient expressed understanding and agreed to proceed.  Follow Up Instructions:    I discussed the assessment and treatment plan with the patient. The patient was provided an opportunity to ask questions and all were answered. The patient agreed with the plan and demonstrated an understanding of the instructions.   The patient was advised to call back or seek an in-person evaluation if the symptoms worsen or if the condition fails to improve as anticipated.  I provided 30 minutes of non-face-to-face time during this encounter.   Melvenia Beam, MD  02/24/2021: Patient is doing better, only 4 migraines a month, rizatriptan helps but makes her grogy, on emgality. Her daughter likes Arie Sabina, maxalt makes her tired, we discussed using nurtec which also helps, can use it acutely on onset but also in a preventative manner (1/2 life is 11-12 hours so if feel a migriane may come on can take it) we discussed options and I answered questions (see plan below). She wants to stay on emgality, use maxalt and nurtec.   HPI:  Meredith Blackwell is a 56 y.o. female here as requested by Crist Infante, MD for migraines since the 4th grade. PMHx anemia, asthma, environmental and seasonal allergies, GERD, glaucoma, breast lumpectomy, back surgery, never smoked, appendectomy, iron deficiency, hyperlipidemia, depression, tinnitus, acute maxillary sinusitis, prediabetes. Her daughters Meredith Blackwell  and Meredith Blackwell are patients here for migraines as well.   She has daily headaches, twice monthly migraines. Started worsening earlier this year and would last for days. Started in 4th grade. Migraines on the left side of her face, nausea, she can have numbness in the face before migraines rarely, throbbing,light sensitivity, they can make her feel very badly, movement makes it worse, can also happen on the right side in fact she has had a headache on the right for 3 days now, behind the eye and radiating, can be the whole head, wakes up with headaches, she is tired but doesn't sleep well (consider a sleep study), she was taking a lot of advil and stopped doing that, no aura often, she doesn't remember a time not having headaches. Daily headaches for years. 8 moderately to severe migraine days a month that last up to 24 hours. Worsening. Has vision issues, floaters and blurry vision, sitting in a dark room helps, she doesn;t want to get out of bed some days. No other focal neurologic deficits, associated symptoms, inciting events or modifiable factors.  Reviewed notes, labs and imaging from outside physicians, which showed:  Per Dr. Tempie Donning notes, she sees Dr. Maxie Better and Ortho, Dr. Dewitt Rota, Dr. Midge Aver eye, Dr. Tressia Danas GYN, Dr. Ishmael Holter allergy, Dr. Clydene Laming dermatology, Dr. Alethia Berthold optometry, Dr. Brantley Stage and surgery.  She is on sumatriptan hand.  Patient saw ophthalmology in 2014, for glaucoma evaluation and possibly complications from asthma meds, history of narrow angle glaucoma, uncontrolled asthma, has tried Singulair Zyrtec ProAir Symbicort and Nasonex. Amitriptyline, maxalt, aimovig contraindicated due to constipation  Labs reviewed from June 12, 2020 included unremarkable CMP with BUN 16 and creatinine 0.6, CBC from Jun 04, 2020 was normal TSH also from Jun 04, 2020 was normal, hemoglobin A1c in Jun 04, 2020 was 6.3.  I reviewed Dr. Tempie Donning examination which was normal including eyes, ears, neck,  skin, heart, lungs, abdomen.  From a thorough review of records, medications tried that can be used in migraine and headache management include: Ibuprofen, Tylenol, gabapentin, melatonin, Imitrex, propranolol contraindicated due to asthma, Topamax contraindicated due to glaucoma, at the time her exam was 20/20 right and 20/20 left, pressure was 20 in the right and 20 in the left, pupils were equally round and reactive to light, visual fields were full, extraocular movements were full, slit-lamp and funduscopic exam were normal, diagnosed with open angle with borderline findings, bilateral, low risk angle-closure, all meds should be okay, okay to take needed asthma medication.  But continue observation.  Review of Systems: Patient complains of symptoms per HPI as well as the following symptoms: constiptaion . Pertinent negatives and positives per HPI. All others negative    Social History   Socioeconomic History   Marital status: Married    Spouse name: Not on file   Number of children: Not on file   Years of education: Not on file   Highest education level: Not on file  Occupational History   Not on file  Tobacco Use   Smoking status: Never   Smokeless tobacco: Never  Substance and Sexual Activity   Alcohol use: Yes    Comment: 3 glasses of wine a week.   Drug use: No   Sexual activity: Not on file  Other Topics Concern   Not on file  Social History Narrative   Coffe decaf one cup daily.  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     Social Determinants of Health   Financial Resource Strain: Not on file  Food Insecurity: Not on file  Transportation Needs: Not on file  Physical Activity: Not on file  Stress: Not on file  Social Connections: Not on file  Intimate Partner Violence: Not on file    Family History  Problem Relation Age of Onset   Breast cancer Sister    Cancer Sister    Migraines Sister    Migraines Sister    Breast cancer  Maternal Aunt    Breast cancer Cousin    Diabetes Other    Hyperlipidemia Other    Stroke Other    Cancer Other    Asthma Other    Colon cancer Neg Hx    Esophageal cancer Neg Hx    Rectal cancer Neg Hx    Stomach cancer Neg Hx     Past Medical History:  Diagnosis Date   Abdominal pain    Anal spasm    Anemia    Asthma    Constipation    Diarrhea    Dysphagia    Environmental and seasonal allergies    Family history of bladder cancer    Family history of breast cancer    Family history of melanoma    Family history of ovarian cancer    GERD (gastroesophageal reflux disease)    Glaucoma    Hemorrhoid    Nausea     Patient Active Problem List   Diagnosis Date Noted   Migraine without aura and without status migrainosus, not intractable 02/24/2021   Migraine with aura and without status migrainosus, not intractable 11/21/2020  Lumbar radiculopathy 11/21/2020   Hot flashes due to menopause 11/21/2020   Genetic testing 07/30/2020   Family history of breast cancer 07/08/2020   Family history of ovarian cancer 07/08/2020   Family history of bladder cancer 07/08/2020   Family history of melanoma 07/08/2020    Past Surgical History:  Procedure Laterality Date   ABDOMINAL HYSTERECTOMY     pt says hysterectomy was vaginal   APPENDECTOMY     BACK SURGERY     BREAST EXCISIONAL BIOPSY     BREAST LUMPECTOMY     NOSE SURGERY     SHOULDER SURGERY     TONSILLECTOMY     WISDOM TOOTH EXTRACTION      Current Outpatient Medications  Medication Sig Dispense Refill   Rimegepant Sulfate (NURTEC) 75 MG TBDP Take 75 mg by mouth daily as needed. For migraines. Take as close to onset of migraine as possible. One daily maximum. 16 tablet 11   acetaminophen (TYLENOL) 500 MG tablet Take 500 mg by mouth every 6 (six) hours as needed.     albuterol (PROVENTIL HFA;VENTOLIN HFA) 108 (90 Base) MCG/ACT inhaler Inhale 2 puffs into the lungs every 6 (six) hours as needed for wheezing or  shortness of breath.     budesonide-formoterol (SYMBICORT) 160-4.5 MCG/ACT inhaler Inhale 2 puffs into the lungs 2 (two) times daily.     cetirizine (ZYRTEC) 10 MG tablet Take 10 mg by mouth daily.     famotidine (PEPCID) 20 MG tablet Take 20 mg by mouth 2 (two) times daily.     Ferrous Sulfate (IRON) 325 (65 Fe) MG TABS Take by mouth.     fluticasone (FLONASE) 50 MCG/ACT nasal spray Place into both nostrils daily.     gabapentin (NEURONTIN) 300 MG capsule Take 1 capsule (300 mg total) by mouth at bedtime. 90 capsule 1   Galcanezumab-gnlm (EMGALITY) 120 MG/ML SOAJ Inject 120 mg into the skin every 30 (thirty) days. 1.12 mL 11   Melatonin 3 MG TABS Take by mouth.     montelukast (SINGULAIR) 10 MG tablet Take 10 mg by mouth at bedtime.     ondansetron (ZOFRAN-ODT) 4 MG disintegrating tablet Take 1-2 tablets (4-8 mg total) by mouth every 8 (eight) hours as needed for nausea. May also take with maxalt for migraine 30 tablet 3   pantoprazole (PROTONIX) 40 MG tablet pantoprazole 40 mg tablet,delayed release  TAKE 1 TABLET BY MOUTH EVERY DAY     pseudoephedrine (SUDAFED) 30 MG tablet Take 30 mg by mouth every 4 (four) hours as needed for congestion.     raloxifene (EVISTA) 60 MG tablet raloxifene 60 mg tablet  TAKE 1 TABLET BY MOUTH ONCE DAILY     ranitidine (ZANTAC) 150 MG capsule Take 150 mg by mouth 2 (two) times daily.     Rimegepant Sulfate (NURTEC) 75 MG TBDP Take 75 mg by mouth daily as needed. For migraines. Take as close to onset of migraine as possible. One daily maximum. 4 tablet 0   rizatriptan (MAXALT-MLT) 10 MG disintegrating tablet Take 1 tablet (10 mg total) by mouth as needed for migraine. May repeat in 2 hours if needed 9 tablet 11   vitamin C (ASCORBIC ACID) 500 MG tablet Take 500 mg by mouth daily.     Current Facility-Administered Medications  Medication Dose Route Frequency Provider Last Rate Last Admin   0.9 %  sodium chloride infusion  500 mL Intravenous Continuous Nandigam,  Venia Minks, MD       Galcanezumab-gnlm  SOAJ 240 mg  240 mg Subcutaneous Once Melvenia Beam, MD        Allergies as of 02/24/2021 - Review Complete 11/21/2020  Allergen Reaction Noted   Doxycycline  12/24/2015   Septra [sulfamethoxazole-trimethoprim]  12/24/2015    Vitals: There were no vitals taken for this visit. Last Weight:  Wt Readings from Last 1 Encounters:  11/21/20 178 lb (80.7 kg)   Last Height:   Ht Readings from Last 1 Encounters:  11/21/20 5\' 7"  (1.702 m)    Physical exam: Exam: Gen: NAD, conversant      CV:  Denies palpitations or chest pain or SOB. VS: Breathing at a normal rate. Weight appears within normal limits. Not febrile. Eyes: Conjunctivae clear without exudates or hemorrhage  Neuro: Detailed Neurologic Exam  Speech:    Speech is normal; fluent and spontaneous with normal comprehension.  Cognition:    The patient is oriented to person, place, and time;     recent and remote memory intact;     language fluent;     normal attention, concentration,     fund of knowledge Cranial Nerves:    The pupils are equal, round, and reactive to light. Visual fields are full to finger confrontation. Extraocular movements are intact.  The face is symmetric with normal sensation. The palate elevates in the midline. Hearing intact. Voice is normal. Shoulder shrug is normal. The tongue has normal motion without fasciculations.   Motor Observation:   no involuntary movements noted. Tone:    Appears normal  Posture:    Posture is normal. normal erect    Strength:    Strength is anti-gravity and symmetric in the upper and lower limbs.      Sensation: intact to LT      Assessment/Plan: Patient now with episodic migraines, doing much better, 4 migraine days a month and 2-3 tension type (7 total headache days a month)  Prevention: Emgality helping doing much better, has improved > 50% from her chronic headaches to just 7 total a month. If this does not improve  over the months can try Ajovy (daughter did better on Ajovy but Emgality is preferred by her insurance)  - wakes up with headaches, she is tired but doesn't sleep well (consider a sleep study, at this time she declines). Gabapentin may help with radiculopathy and hot flashes, she already takes it sometimes, I can refill it, take it at bedtime may help. This is improved  - Migraine/headache prevention: Continue Emgality.    - Acute/emergent: Try Rizatriptan: Please take one tablet at the onset of your headache. If it does not improve the symptoms please take one additional tablet. Do not take more then 2 tablets in 24hrs. Do not take use more then 2 to 3 times in a week. - Acute/Emergent: Try Nurtec - maxalt works but makes her tired, use nurtec at work - Ondansetron: May take for nause motion sickness - Nurtec, maxalt and Ondansetron may be taken together for severe migraine/headache - MRI brain: normal, reviewed images  Discussed: To prevent or relieve headaches, try the following: Cool Compress. Lie down and place a cool compress on your head.  Avoid headache triggers. If certain foods or odors seem to have triggered your migraines in the past, avoid them. A headache diary might help you identify triggers.  Include physical activity in your daily routine. Try a daily walk or other moderate aerobic exercise.  Manage stress. Find healthy ways to cope with the stressors, such as  delegating tasks on your to-do list.  Practice relaxation techniques. Try deep breathing, yoga, massage and visualization.  Eat regularly. Eating regularly scheduled meals and maintaining a healthy diet might help prevent headaches. Also, drink plenty of fluids.  Follow a regular sleep schedule. Sleep deprivation might contribute to headaches Consider biofeedback. With this mind-body technique, you learn to control certain bodily functions -- such as muscle tension, heart rate and blood pressure -- to prevent headaches or  reduce headache pain.    Proceed to emergency room if you experience new or worsening symptoms or symptoms do not resolve, if you have new neurologic symptoms or if headache is severe, or for any concerning symptom.   Provided education and documentation from American headache Society toolbox including articles on: chronic migraine medication overuse headache, chronic migraines, prevention of migraines, behavioral and other nonpharmacologic treatments for headache.  Discussed:  There is increased risk for stroke in women with migraine with aura and a contraindication for the combined contraceptive pill for use by women who have migraine with aura. The risk for women with migraine without aura is lower. However other risk factors like smoking are far more likely to increase stroke risk than migraine. There is a recommendation for no smoking and for the use of OCPs without estrogen such as progestogen only pills particularly for women with migraine with aura.Marland Kitchen People who have migraine headaches with auras may be 3 times more likely to have a stroke caused by a blood clot, compared to migraine patients who don't see auras. Women who take hormone-replacement therapy may be 30 percent more likely to suffer a clot-based stroke than women not taking medication containing estrogen. Other risk factors like smoking and high blood pressure may be  much more important.    No orders of the defined types were placed in this encounter.   Meds ordered this encounter  Medications   Rimegepant Sulfate (NURTEC) 75 MG TBDP    Sig: Take 75 mg by mouth daily as needed. For migraines. Take as close to onset of migraine as possible. One daily maximum.    Dispense:  16 tablet    Refill:  11   Galcanezumab-gnlm (EMGALITY) 120 MG/ML SOAJ    Sig: Inject 120 mg into the skin every 30 (thirty) days.    Dispense:  1.12 mL    Refill:  11     Cc: Crist Infante, MD,  Crist Infante, MD  Sarina Ill, MD  [facility] [address] [place], [place] [address]  Phone [phone] Fax [phone]

## 2021-02-26 ENCOUNTER — Telehealth: Payer: Self-pay | Admitting: *Deleted

## 2021-02-26 NOTE — Telephone Encounter (Signed)
Completed Nurtec PA on Cover My Meds. Key: [id number]. Awaiting determination from [place].

## 2021-02-26 NOTE — Telephone Encounter (Addendum)
Dear Eloisa Northern: Were pleased to let you know that weve approved your or your doctors request for coverage (sometimes called a prior authorization) for Nurtec 75MG  OR TBDP. You can now fill your prescription, and it will be covered according to your plan. As long as you remain covered by your prescription drug plan and there are no changes to your plan benefits, this request is approved from 02/26/2021 to 02/26/2022. When this approval expires, please speak to your doctor about your treatment.  Received approval letter from [employer]. Faxed to pharmacy. Received a receipt of confirmation.

## 2021-04-16 DIAGNOSIS — H40033 Anatomical narrow angle, bilateral: Secondary | ICD-10-CM | POA: Diagnosis not present

## 2021-04-26 ENCOUNTER — Encounter: Payer: Self-pay | Admitting: Neurology

## 2021-04-28 NOTE — Telephone Encounter (Signed)
Working on PA

## 2021-04-28 NOTE — Telephone Encounter (Signed)
Completed Emgality PA on Cover My Meds. Key: [id number]. Awaiting determination from [employer].  ?

## 2021-04-28 NOTE — Telephone Encounter (Signed)
We?re pleased to let you know that we?ve approved your or your doctor?s request for coverage ?for Emgality '120mg'$ /mL Pen Auto-Inj (galcanezumab-gnlm). You can now fill your prescription, ?and it will be covered according to your plan. ?As long as you remain covered by your prescription drug plan and there are no changes to your ?plan benefits, this request is approved from 04/28/2021 to 04/29/2022. When this approval ?expires, please speak to your doctor about your treatment. ? ?Letter faxed to [facility]. Received a receipt of confirmation. ? ?

## 2021-05-01 ENCOUNTER — Encounter: Payer: Self-pay | Admitting: Neurology

## 2021-05-07 DIAGNOSIS — H40033 Anatomical narrow angle, bilateral: Secondary | ICD-10-CM | POA: Diagnosis not present

## 2021-05-23 DIAGNOSIS — R7301 Impaired fasting glucose: Secondary | ICD-10-CM | POA: Diagnosis not present

## 2021-05-23 DIAGNOSIS — R42 Dizziness and giddiness: Secondary | ICD-10-CM | POA: Diagnosis not present

## 2021-05-23 DIAGNOSIS — D509 Iron deficiency anemia, unspecified: Secondary | ICD-10-CM | POA: Diagnosis not present

## 2021-05-23 DIAGNOSIS — E876 Hypokalemia: Secondary | ICD-10-CM | POA: Diagnosis not present

## 2021-06-05 DIAGNOSIS — Z01419 Encounter for gynecological examination (general) (routine) without abnormal findings: Secondary | ICD-10-CM | POA: Diagnosis not present

## 2021-06-05 DIAGNOSIS — Z6828 Body mass index (BMI) 28.0-28.9, adult: Secondary | ICD-10-CM | POA: Diagnosis not present

## 2021-06-05 DIAGNOSIS — N907 Vulvar cyst: Secondary | ICD-10-CM | POA: Diagnosis not present

## 2021-06-05 DIAGNOSIS — N952 Postmenopausal atrophic vaginitis: Secondary | ICD-10-CM | POA: Diagnosis not present

## 2021-06-16 ENCOUNTER — Ambulatory Visit (INDEPENDENT_AMBULATORY_CARE_PROVIDER_SITE_OTHER): Payer: BC Managed Care – PPO | Admitting: Cardiovascular Disease

## 2021-06-16 ENCOUNTER — Ambulatory Visit (INDEPENDENT_AMBULATORY_CARE_PROVIDER_SITE_OTHER): Payer: BC Managed Care – PPO

## 2021-06-16 ENCOUNTER — Encounter: Payer: Self-pay | Admitting: Cardiovascular Disease

## 2021-06-16 VITALS — BP 130/80 | HR 73 | Ht 66.0 in | Wt 173.6 lb

## 2021-06-16 DIAGNOSIS — R55 Syncope and collapse: Secondary | ICD-10-CM

## 2021-06-16 DIAGNOSIS — G43009 Migraine without aura, not intractable, without status migrainosus: Secondary | ICD-10-CM | POA: Diagnosis not present

## 2021-06-16 DIAGNOSIS — J4520 Mild intermittent asthma, uncomplicated: Secondary | ICD-10-CM

## 2021-06-16 NOTE — Patient Instructions (Signed)
Medication Instructions:  Your Physician recommend you continue on your current medication as directed.    *If you need a refill on your cardiac medications before your next appointment, please call your pharmacy*   Lab Work: Your physician recommends that you return for lab work today (BMP, Mg)  If you have labs (blood work) drawn today and your tests are completely normal, you will receive your results only by: MyChart Message (if you have MyChart) OR A paper copy in the mail If you have any lab test that is abnormal or we need to change your treatment, we will call you to review the results.   Testing/Procedures: Your physician has requested that you have an echocardiogram. Echocardiography is a painless test that uses sound waves to create images of your heart. It provides your doctor with information about the size and shape of your heart and how well your heart's chambers and valves are working. This procedure takes approximately one hour. There are no restrictions for this procedure. [facility] has requested that you have a carotid duplex. This test is an ultrasound of the carotid arteries in your neck. It looks at blood flow through these arteries that supply the brain with blood. Allow one hour for this exam. There are no restrictions or special instructions. [address]  Your physician has recommended that you wear a 3 day Zio monitor.   This monitor is a medical device that records the heart's electrical activity. Doctors most often use these monitors to diagnose arrhythmias. Arrhythmias are problems with the speed or rhythm of the heartbeat. The monitor is a small device applied to your chest. You can wear one while you do your normal daily activities. While wearing this monitor if you have any symptoms to push the button and record what you felt. Once you have worn this monitor for the period of time provider prescribed  (Usually 14 days), you will return the monitor device in the postage paid box. Once it is returned they will download the data collected and provide [place] with a report which the provider will then review and we will call you with those results. Important tips:  Avoid showering during the first 24 hours of wearing the monitor. Avoid excessive sweating to help maximize wear time. Do not submerge the device, no hot tubs, and no swimming pools. Keep any lotions or oils away from the patch. After 24 hours you may shower with the patch on. Take brief showers with your back facing the shower head.  Do not remove patch once it has been placed because that will interrupt data and decrease adhesive wear time. Push the button when you have any symptoms and write down what you were feeling. Once you have completed wearing your monitor, remove and place into box which has postage paid and place in your outgoing mailbox.  If for some reason you have misplaced your box then call our office and we can provide another box and/or mail it off for you.      Follow-Up: At [facility], you and your health needs are our priority.  As part of our continuing mission to provide you with exceptional heart care, we have created designated Provider Care Teams.  These Care Teams include your primary Cardiologist (physician) and Advanced Practice Providers (APPs -  Physician Assistants and Nurse Practitioners) who all work together to provide you with the care you need, when you need it.  We recommend signing up for the patient portal called "MyChart".  Sign up information is provided on this After Visit Summary.  MyChart is used to connect with patients for Virtual Visits (Telemedicine).  Patients are able to view lab/test results, encounter notes, upcoming appointments, etc.  Non-urgent messages can be sent to your provider as well.   To learn more about what you can do with MyChart, go to [email or web address].    Your  next appointment:   2 month(s)  The format for your next appointment:   In Person  Provider:   Shelva Majestic, MD {

## 2021-06-16 NOTE — Progress Notes (Unsigned)
Enrolled for [employer] to mail a ZIO XT long term holter monitor to the patients address on file.  

## 2021-06-16 NOTE — Progress Notes (Signed)
Cardiology Office Note    Date:  06/30/2021   ID:  Meredith Blackwell, DOB 04/12/1965, MRN [id number]  PCP:  Crist Infante, MD  Cardiologist:  Shelva Majestic, MD   New cardiology consultation referred through the courtesy of Geoffery Lyons, FNP at [facility] for evaluation of presyncope.   History of Present Illness:  Meredith Blackwell is a 56 y.o. female who denies any awareness of prior cardiac history.  She has a history of migraine headaches and has experienced several episodes of dizziness where she felt unstable and seem to be worse with moving her head and position changes raising concern for possible vertigo.  She admits to 2 episodes of presyncope she has had some visual dimming but denied any frank syncope.  She denies associated chest pain or episodes of palpitations.  She has a history of asthma..  She is followed by Dr. Crist Infante at [facility].  She recently saw Earle Gell, FNP and is now referred for cardiology evaluation in light of her dizzy episodes.   Past Medical History:  Diagnosis Date   Abdominal pain    Anal spasm    Anemia    Asthma    Constipation    Diarrhea    Dysphagia    Environmental and seasonal allergies    Family history of bladder cancer    Family history of breast cancer    Family history of melanoma    Family history of ovarian cancer    GERD (gastroesophageal reflux disease)    Glaucoma    Hemorrhoid    Nausea     Past Surgical History:  Procedure Laterality Date   ABDOMINAL HYSTERECTOMY     pt says hysterectomy was vaginal   APPENDECTOMY     BACK SURGERY     BREAST EXCISIONAL BIOPSY     BREAST LUMPECTOMY     NOSE SURGERY     SHOULDER SURGERY     TONSILLECTOMY     WISDOM TOOTH EXTRACTION      Current Medications: Outpatient Medications Prior to Visit  Medication Sig Dispense Refill   albuterol (PROVENTIL HFA;VENTOLIN HFA) 108 (90 Base) MCG/ACT inhaler Inhale 2 puffs into the lungs every 6 (six) hours as needed  for wheezing or shortness of breath.     azithromycin (ZITHROMAX) 500 MG tablet Take 500 mg by mouth daily.     budesonide-formoterol (SYMBICORT) 160-4.5 MCG/ACT inhaler Inhale 2 puffs into the lungs 2 (two) times daily.     cetirizine (ZYRTEC) 10 MG tablet Take 10 mg by mouth daily.     Cholecalciferol 50 MCG (2000 UT) CAPS Take 2,000 Units by mouth daily.     famotidine (PEPCID) 20 MG tablet Take 20 mg by mouth 2 (two) times daily.     fluticasone (FLONASE) 50 MCG/ACT nasal spray Place into both nostrils daily.     gabapentin (NEURONTIN) 300 MG capsule Take 1 capsule (300 mg total) by mouth at bedtime. 90 capsule 1   Galcanezumab-gnlm (EMGALITY) 120 MG/ML SOAJ Inject 120 mg into the skin every 30 (thirty) days. 1.12 mL 11   ibuprofen (ADVIL) 200 MG tablet Take 200 mg by mouth as needed.     Melatonin 3 MG TABS Take by mouth.     montelukast (SINGULAIR) 10 MG tablet Take 10 mg by mouth at bedtime.     ondansetron (ZOFRAN-ODT) 4 MG disintegrating tablet Take 1-2 tablets (4-8 mg total) by mouth every 8 (eight) hours as needed for nausea. May  also take with maxalt for migraine 30 tablet 3   pantoprazole (PROTONIX) 40 MG tablet pantoprazole 40 mg tablet,delayed release  TAKE 1 TABLET BY MOUTH EVERY DAY     predniSONE (STERAPRED UNI-PAK 21 TAB) 5 MG (21) TBPK tablet Take by mouth as directed.     pseudoephedrine (SUDAFED) 30 MG tablet Take 30 mg by mouth every 4 (four) hours as needed for congestion.     raloxifene (EVISTA) 60 MG tablet raloxifene 60 mg tablet  TAKE 1 TABLET BY MOUTH ONCE DAILY     Rimegepant Sulfate (NURTEC) 75 MG TBDP Take 75 mg by mouth daily as needed. For migraines. Take as close to onset of migraine as possible. One daily maximum. 16 tablet 11   rizatriptan (MAXALT-MLT) 10 MG disintegrating tablet Take 1 tablet (10 mg total) by mouth as needed for migraine. May repeat in 2 hours if needed 9 tablet 11   vitamin C (ASCORBIC ACID) 500 MG tablet Take 500 mg by mouth daily.      Rimegepant Sulfate (NURTEC) 75 MG TBDP Take 75 mg by mouth daily as needed. For migraines. Take as close to onset of migraine as possible. One daily maximum. 4 tablet 0   acetaminophen (TYLENOL) 500 MG tablet Take 500 mg by mouth every 6 (six) hours as needed. (Patient not taking: Reported on 06/16/2021)     ranitidine (ZANTAC) 150 MG capsule Take 150 mg by mouth 2 (two) times daily. (Patient not taking: Reported on 06/16/2021)     Ferrous Sulfate (IRON) 325 (65 Fe) MG TABS Take by mouth.     0.9 %  sodium chloride infusion      Galcanezumab-gnlm SOAJ 240 mg      No facility-administered medications prior to visit.     Allergies:   Doxycycline and Septra [sulfamethoxazole-trimethoprim]   Social History   Socioeconomic History   Marital status: Married    Spouse name: Not on file   Number of children: Not on file   Years of education: Not on file   Highest education level: Not on file  Occupational History   Not on file  Tobacco Use   Smoking status: Never   Smokeless tobacco: Never  Substance and Sexual Activity   Alcohol use: Yes    Comment: 3 glasses of wine a week.   Drug use: No   Sexual activity: Not on file  Other Topics Concern   Not on file  Social History Narrative   Coffe decaf one cup daily.  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     Social Determinants of Health   Financial Resource Strain: Not on file  Food Insecurity: Not on file  Transportation Needs: Not on file  Physical Activity: Not on file  Stress: Not on file  Social Connections: Not on file    Socially, she was born in [place].  She is married for 31 years and has 4 children.  She works in [employer] at [employer].  She has 2 BS degrees.  There is no history of tobacco use.  She drinks a glass of wine 1 to 2 days/week.  Family History:  The patient's family history includes Asthma in an other family member; Breast cancer in her  cousin, maternal aunt, and sister; Cancer in her sister and another family member; Diabetes in an other family member; Hyperlipidemia in an other family member; Migraines in her sister and sister; Stroke in an other  family member.   Both parents are deceased, mother died at age 93 and had diabetes, CHF, and kidney failure.  Father died at age 65 and had hypertension, diabetes mellitus, remote CVA, and bladder cancer. She has 1 living brother age 8 who has diabetes and hypertension.  She has 4 sisters ages 37, 50, 66 and 27 with a history of diabetes, ovarian and breast cancer.  Her children are aged 33, 20, 61 and 94 and her oldest child has asthma.   ROS General: Negative; No fevers, chills, or night sweats;  HEENT: Negative; No changes in vision or hearing, sinus congestion, difficulty swallowing Pulmonary: History of asthma Cardiovascular: See HPI GI: Negative; No nausea, vomiting, diarrhea, or abdominal pain GU: Negative; No dysuria, hematuria, or difficulty voiding Musculoskeletal: Negative; no myalgias, joint pain, or weakness Hematologic/Oncology: Negative; no easy bruising, bleeding Endocrine: Negative; no heat/cold intolerance; no diabetes Neuro: History of migraine headaches Skin: Negative; No rashes or skin lesions Psychiatric: Negative; No behavioral problems, depression Sleep: Negative; No snoring, daytime sleepiness, hypersomnolence, bruxism, restless legs, hypnogognic hallucinations, no cataplexy Other comprehensive 14 point system review is negative.   PHYSICAL EXAM:   VS:  BP 130/80 (BP Location: Left Arm)   Pulse 73   Ht 5' 6" (1.676 m)   Wt 173 lb 9.6 oz (78.7 kg)   SpO2 97%   BMI 28.02 kg/m     Repeat blood pressure by me was 124/76 supine, 128/80 sitting, and her blood pressure actually increased to 134/82 standing.  Wt Readings from Last 3 Encounters:  06/16/21 173 lb 9.6 oz (78.7 kg)  11/21/20 178 lb (80.7 kg)  03/20/16 165 lb (74.8 kg)    General:  Alert, oriented, no distress.  Skin: normal turgor, no rashes, warm and dry HEENT: Normocephalic, atraumatic. Pupils equal round and reactive to light; sclera anicteric; extraocular muscles intact;  Nose without nasal septal hypertrophy Mouth/Parynx benign; Mallinpatti scale 2 Neck: No JVD, no carotid bruits; normal carotid upstroke Lungs: clear to ausculatation and percussion; no wheezing or rales Chest wall: without tenderness to palpitation Heart: PMI not displaced, RRR, s1 s2 normal, 1/6 systolic murmur, no diastolic murmur, no rubs, gallops, thrills, or heaves Abdomen: soft, nontender; no hepatosplenomehaly, BS+; abdominal aorta nontender and not dilated by palpation. Back: no CVA tenderness Pulses 2+ Musculoskeletal: full range of motion, normal strength, no joint deformities Extremities: no clubbing cyanosis or edema, Homan's sign negative  Neurologic: grossly nonfocal; Cranial nerves grossly wnl Psychologic: Normal mood and affect   Studies/Labs Reviewed:   June 16, 2021 ECG (independently read by me): NSR at 73, QTc 462 msec, no ectopy  Recent Labs:    Latest Ref Rng & Units 06/16/2021    4:39 PM 05/26/2010    2:15 PM 12/14/2006   12:30 PM  BMP  Glucose 70 - 99 mg/dL 92  85  85   BUN 6 - 24 mg/dL _0 Creatinine 0.57 - 1.00 mg/dL 0.69  0.56  0.69   BUN/Creat Ratio 9 - 23 19     Sodium 134 - 144 mmol/L 145  140  137   Potassium 3.5 - 5.2 mmol/L 4.0  3.9  3.8   Chloride 96 - 106 mmol/L 105  104  105   CO2 20 - 29 mmol/L _1 Calcium 8.7 - 10.2 mg/dL 9.6  9.1  9.1         Latest Ref Rng & Units 05/26/2010  2:15 PM  Hepatic Function  Total Protein 6.0 - 8.3 g/dL 7.3   Albumin 3.5 - 5.2 g/dL 4.4   AST 0 - 37 U/L 16   ALT 0 - 35 U/L 24   Alk Phosphatase 39 - 117 U/L 64   Total Bilirubin 0.3 - 1.2 mg/dL 0.3        Latest Ref Rng & Units 05/26/2010    2:15 PM 12/14/2006   12:30 PM  CBC  WBC 4.0 - 10.5 K/uL 6.7  7.0   Hemoglobin 12.0 - 15.0 g/dL  13.3  12.9   Hematocrit 36.0 - 46.0 % 40.8  37.4   Platelets 150 - 400 K/uL 254  229    Lab Results  Component Value Date   MCV 89.5 05/26/2010   MCV 87.8 12/14/2006   No results found for: "TSH" No results found for: "HGBA1C"   BNP No results found for: "BNP"  ProBNP No results found for: "PROBNP"   Lipid Panel  No results found for: "CHOL", "TRIG", "HDL", "CHOLHDL", "VLDL", "LDLCALC", "LDLDIRECT", "LABVLDL"   RADIOLOGY: No results found.   Additional studies/ records that were reviewed today include:   I reviewed the records of [facility].   ASSESSMENT:    1. Pre-syncope   2. Migraine without aura and without status migrainosus, not intractable   3. Mild intermittent asthma without complication     PLAN:  Ms. Shuntia Exton is a very pleasant 56 year old female has a history of migraine headaches, followed by Dr. Vivi Martens as well as a history of asthma.  She recently has noticed episodes of some mild dizziness associated with headache which became progressive and has had 2 episodes of presyncope where she felt unstable had transient vision dizziness which resolved spontaneously.  These are unassociated with any awareness of chest tightness or pressure or any awareness of arrhythmia.  Her blood pressure today is stable and there is no evidence for orthostatic hypotension.  Her ECG shows stable cardiac rhythm with normal sinus rhythm at 73 and QTc interval 463 without ectopy.  She has had some issues with left leg discomfort.  I reviewed the records of [facility] including most recent laboratory from May 23, 2021.  Renal function was stable with a BUN of 15 creatinine 0.8.  At that time potassium was 3.2.  There was upper normal to minimally increased ALT at 41 with normal AST at 22.  I have recommended a follow-up BMET and magnesium level today.  With her presyncopal symptoms I am recommending that she undergo a 2D echo Doppler study and also we will schedule her for  carotid duplex imaging and I have suggested that she wear a 3-day Zio patch monitor to assess her rhythm.  I recommended that she stay hydrated.  Lipid studies from 2022 had shown an LDL cholesterol at 131 with total cholesterol 201 and triglycerides 96.  We discussed improved diet and suggested Mediterranean diet is a good heart healthy diet.  I will contact her with the results of her laboratory and further studies and will plan to see her in 2 months for follow-up evaluation or sooner as needed.   Medication Adjustments/Labs and Tests Ordered: Current medicines are reviewed at length with the patient today.  Concerns regarding medicines are outlined above.  Medication changes, Labs and Tests ordered today are listed in the Patient Instructions below. Patient Instructions  Medication Instructions:  Your Physician recommend you continue on your current medication as directed.    *If you  need a refill on your cardiac medications before your next appointment, please call your pharmacy*   Lab Work: Your physician recommends that you return for lab work today (BMP, Mg)  If you have labs (blood work) drawn today and your tests are completely normal, you will receive your results only by: MyChart Message (if you have MyChart) OR A paper copy in the mail If you have any lab test that is abnormal or we need to change your treatment, we will call you to review the results.   Testing/Procedures: Your physician has requested that you have an echocardiogram. Echocardiography is a painless test that uses sound waves to create images of your heart. It provides your doctor with information about the size and shape of your heart and how well your heart's chambers and valves are working. This procedure takes approximately one hour. There are no restrictions for this procedure. [place] has requested that you have a carotid duplex. This test is an ultrasound of the  carotid arteries in your neck. It looks at blood flow through these arteries that supply the brain with blood. Allow one hour for this exam. There are no restrictions or special instructions. [place]  Your physician has recommended that you wear a 3 day Zio monitor.   This monitor is a medical device that records the heart's electrical activity. Doctors most often use these monitors to diagnose arrhythmias. Arrhythmias are problems with the speed or rhythm of the heartbeat. The monitor is a small device applied to your chest. You can wear one while you do your normal daily activities. While wearing this monitor if you have any symptoms to push the button and record what you felt. Once you have worn this monitor for the period of time provider prescribed (Usually 14 days), you will return the monitor device in the postage paid box. Once it is returned they will download the data collected and provide [place] with a report which the provider will then review and we will call you with those results. Important tips:  Avoid showering during the first 24 hours of wearing the monitor. Avoid excessive sweating to help maximize wear time. Do not submerge the device, no hot tubs, and no swimming pools. Keep any lotions or oils away from the patch. After 24 hours you may shower with the patch on. Take brief showers with your back facing the shower head.  Do not remove patch once it has been placed because that will interrupt data and decrease adhesive wear time. Push the button when you have any symptoms and write down what you were feeling. Once you have completed wearing your monitor, remove and place into box which has postage paid and place in your outgoing mailbox.  If for some reason you have misplaced your box then call our office and we can provide another box and/or mail it off for you.      Follow-Up: At [facility], you and your health needs are our priority.  As part of our  continuing mission to provide you with exceptional heart care, we have created designated Provider Care Teams.  These Care Teams include your primary Cardiologist (physician) and Advanced Practice Providers (APPs -  Physician Assistants and Nurse Practitioners) who all work together to provide you with the care you need, when you need it.  We recommend signing up for the patient portal called "MyChart".  Sign up information is provided on this  After Visit Summary.  MyChart is used to connect with patients for Virtual Visits (Telemedicine).  Patients are able to view lab/test results, encounter notes, upcoming appointments, etc.  Non-urgent messages can be sent to your provider as well.   To learn more about what you can do with MyChart, go to [email or web address].    Your next appointment:   2 month(s)  The format for your next appointment:   In Person  Provider:   Shelva Majestic, MD {          Signed, Shelva Majestic, MD  06/30/2021 12:04 PM    [facility] [address] [place], [place], [place]   [address] Phone: [phone]

## 2021-06-17 LAB — BASIC METABOLIC PANEL
BUN/Creatinine Ratio: 19 (ref 9–23)
BUN: 13 mg/dL (ref 6–24)
CO2: 25 mmol/L (ref 20–29)
Calcium: 9.6 mg/dL (ref 8.7–10.2)
Chloride: 105 mmol/L (ref 96–106)
Creatinine, Ser: 0.69 mg/dL (ref 0.57–1.00)
Glucose: 92 mg/dL (ref 70–99)
Potassium: 4 mmol/L (ref 3.5–5.2)
Sodium: 145 mmol/L — ABNORMAL HIGH (ref 134–144)
eGFR: 102 mL/min/{1.73_m2} (ref >59)

## 2021-06-17 LAB — MAGNESIUM: Magnesium: 2.4 mg/dL — ABNORMAL HIGH (ref 1.6–2.3)

## 2021-06-21 DIAGNOSIS — N61 Mastitis without abscess: Secondary | ICD-10-CM | POA: Diagnosis not present

## 2021-06-22 DIAGNOSIS — R55 Syncope and collapse: Secondary | ICD-10-CM

## 2021-06-24 DIAGNOSIS — N644 Mastodynia: Secondary | ICD-10-CM | POA: Diagnosis not present

## 2021-06-30 ENCOUNTER — Ambulatory Visit (HOSPITAL_COMMUNITY)
Admission: RE | Admit: 2021-06-30 | Discharge: 2021-06-30 | Disposition: A | Discharge status: Home / Self Care | Payer: BC Managed Care – PPO | Source: Ambulatory Visit | Attending: Cardiology | Admitting: Cardiology

## 2021-06-30 ENCOUNTER — Encounter: Payer: Self-pay | Admitting: Cardiovascular Disease

## 2021-06-30 DIAGNOSIS — R55 Syncope and collapse: Secondary | ICD-10-CM | POA: Insufficient documentation

## 2021-07-07 ENCOUNTER — Ambulatory Visit (HOSPITAL_COMMUNITY): Payer: BC Managed Care – PPO | Attending: Internal Medicine

## 2021-07-07 DIAGNOSIS — R55 Syncope and collapse: Secondary | ICD-10-CM

## 2021-07-07 LAB — ECHOCARDIOGRAM COMPLETE
Area-P 1/2: 3.38 cm2
P 1/2 time: 466 msec
S' Lateral: 2.3 cm

## 2021-08-07 DIAGNOSIS — R7301 Impaired fasting glucose: Secondary | ICD-10-CM | POA: Diagnosis not present

## 2021-08-07 DIAGNOSIS — E559 Vitamin D deficiency, unspecified: Secondary | ICD-10-CM | POA: Diagnosis not present

## 2021-08-07 DIAGNOSIS — E785 Hyperlipidemia, unspecified: Secondary | ICD-10-CM | POA: Diagnosis not present

## 2021-08-11 DIAGNOSIS — E559 Vitamin D deficiency, unspecified: Secondary | ICD-10-CM | POA: Diagnosis not present

## 2021-08-11 DIAGNOSIS — Z23 Encounter for immunization: Secondary | ICD-10-CM | POA: Diagnosis not present

## 2021-08-11 DIAGNOSIS — Z1331 Encounter for screening for depression: Secondary | ICD-10-CM | POA: Diagnosis not present

## 2021-08-11 DIAGNOSIS — Z0000 Encounter for general adult medical examination without abnormal findings: Secondary | ICD-10-CM | POA: Diagnosis not present

## 2021-08-12 ENCOUNTER — Other Ambulatory Visit: Payer: Self-pay | Admitting: Internal Medicine

## 2021-08-12 DIAGNOSIS — E785 Hyperlipidemia, unspecified: Secondary | ICD-10-CM

## 2021-08-13 DIAGNOSIS — D2239 Melanocytic nevi of other parts of face: Secondary | ICD-10-CM | POA: Diagnosis not present

## 2021-08-13 DIAGNOSIS — D763 Other histiocytosis syndromes: Secondary | ICD-10-CM | POA: Diagnosis not present

## 2021-08-13 DIAGNOSIS — D225 Melanocytic nevi of trunk: Secondary | ICD-10-CM | POA: Diagnosis not present

## 2021-08-13 DIAGNOSIS — L814 Other melanin hyperpigmentation: Secondary | ICD-10-CM | POA: Diagnosis not present

## 2021-08-13 DIAGNOSIS — L821 Other seborrheic keratosis: Secondary | ICD-10-CM | POA: Diagnosis not present

## 2021-08-13 DIAGNOSIS — D485 Neoplasm of uncertain behavior of skin: Secondary | ICD-10-CM | POA: Diagnosis not present

## 2021-08-25 ENCOUNTER — Ambulatory Visit (INDEPENDENT_AMBULATORY_CARE_PROVIDER_SITE_OTHER): Payer: BC Managed Care – PPO | Admitting: Cardiovascular Disease

## 2021-08-25 ENCOUNTER — Encounter: Payer: Self-pay | Admitting: Cardiovascular Disease

## 2021-08-25 VITALS — BP 110/70 | HR 69 | Ht 67.0 in | Wt 173.6 lb

## 2021-08-25 DIAGNOSIS — G43009 Migraine without aura, not intractable, without status migrainosus: Secondary | ICD-10-CM | POA: Diagnosis not present

## 2021-08-25 DIAGNOSIS — R55 Syncope and collapse: Secondary | ICD-10-CM | POA: Diagnosis not present

## 2021-08-25 DIAGNOSIS — J4520 Mild intermittent asthma, uncomplicated: Secondary | ICD-10-CM | POA: Diagnosis not present

## 2021-08-25 DIAGNOSIS — E782 Mixed hyperlipidemia: Secondary | ICD-10-CM | POA: Diagnosis not present

## 2021-08-25 NOTE — Progress Notes (Signed)
Cardiology Office Note    Date:  08/25/2021   ID:  [name], DOB Jan 08, 1966, MRN [id number]  PCP:  Meredith Infante, MD  Cardiologist:  Meredith Majestic, MD   2 month cardiology consultation initially referred through the courtesy of Meredith Lyons, FNP at [facility] for evaluation of presyncope.   History of Present Illness:  Meredith Blackwell is a 56 y.o. female who denies any awareness of prior cardiac history.  I saw her for my initial evaluation on June 16, 2021.  She presents now for 69-monthfollow-up evaluation.   Meredith Blackwell has a history of migraine headaches and has experienced several episodes of dizziness where she felt unstable and seem to be worse with moving her head and position changes raising concern for possible vertigo.  She admits to 2 episodes of presyncope she has had some visual dimming but denied any frank syncope.  She denies associated chest pain or episodes of palpitations.  She has a history of asthma..  She is followed by Dr. MCrist Infanteat [facility]  She recently saw Meredith Gell FNP and was referred for cardiology evaluation in light of her dizzy episodes.  During that evaluation, I reviewed her most recent laboratory from May 23, 2021.  She had normal renal function.  Potassium was low at 3.2.  I recommended a follow-up bmet and magnesium level which were done that day showed a potassium of 4.0.  Magnesium was 2.4.  Sodium was minimally increased at 145.  She underwent an echo Doppler study on July 07, 2021 which revealed normal LV systolic and diastolic function and normal strain pattern.  EF was 65 to 70%.  There was mild MR. Carotid duplex imaging was essentially normal with only minimal wall thickening or plaque in the left carotid.  She had normal antegrade flow in vertebral arteries and normal bilateral subclavian flow.  She wore a Zio patch monitor which revealed sinus rhythm throughout the study without significant abnormality.  There was  very rare ectopy.  Minimum heart rate was 50 bpm with average heart rate 83 bpm.  Maximum rate was 129.  There were very rare isolated PACs and atrial couplets.  Presently, she has felt improved.  She saw Dr. PHaynes Kernsfor annual physical.  Laboratory at that time showed total cholesterol 228 triglycerides 195 LDL 132 on August 07, 2021.  Her palpitations have significantly improved.  She denies any presyncope or syncope.  Hemoglobin level was mildly increased at 6.1.  She presents for evaluation.   Past Medical History:  Diagnosis Date   Abdominal pain    Anal spasm    Anemia    Asthma    Constipation    Diarrhea    Dysphagia    Environmental and seasonal allergies    Family history of bladder cancer    Family history of breast cancer    Family history of melanoma    Family history of ovarian cancer    GERD (gastroesophageal reflux disease)    Glaucoma    Hemorrhoid    Nausea     Past Surgical History:  Procedure Laterality Date   ABDOMINAL HYSTERECTOMY     pt says hysterectomy was vaginal   APPENDECTOMY     BACK SURGERY     BREAST EXCISIONAL BIOPSY     BREAST LUMPECTOMY     NOSE SURGERY     SHOULDER SURGERY     TONSILLECTOMY     WISDOM TOOTH EXTRACTION  Current Medications: Outpatient Medications Prior to Visit  Medication Sig Dispense Refill   acetaminophen (TYLENOL) 500 MG tablet Take 500 mg by mouth every 6 (six) hours as needed.     budesonide-formoterol (SYMBICORT) 160-4.5 MCG/ACT inhaler Inhale 2 puffs into the lungs 2 (two) times daily.     cetirizine (ZYRTEC) 10 MG tablet Take 10 mg by mouth daily.     Cholecalciferol 50 MCG (2000 UT) CAPS Take 2,000 Units by mouth daily.     famotidine (PEPCID) 20 MG tablet Take 20 mg by mouth 2 (two) times daily.     fluticasone (FLONASE) 50 MCG/ACT nasal spray Place into both nostrils daily.     gabapentin (NEURONTIN) 300 MG capsule Take 1 capsule (300 mg total) by mouth at bedtime. 90 capsule 1   Galcanezumab-gnlm  (EMGALITY) 120 MG/ML SOAJ Inject 120 mg into the skin every 30 (thirty) days. 1.12 mL 11   montelukast (SINGULAIR) 10 MG tablet Take 10 mg by mouth at bedtime.     pantoprazole (PROTONIX) 40 MG tablet pantoprazole 40 mg tablet,delayed release  TAKE 1 TABLET BY MOUTH EVERY DAY     pseudoephedrine (SUDAFED) 30 MG tablet Take 30 mg by mouth every 4 (four) hours as needed for congestion.     raloxifene (EVISTA) 60 MG tablet raloxifene 60 mg tablet  TAKE 1 TABLET BY MOUTH ONCE DAILY     Rimegepant Sulfate (NURTEC) 75 MG TBDP Take 75 mg by mouth daily as needed. For migraines. Take as close to onset of migraine as possible. One daily maximum. 16 tablet 11   rizatriptan (MAXALT-MLT) 10 MG disintegrating tablet Take 1 tablet (10 mg total) by mouth as needed for migraine. May repeat in 2 hours if needed 9 tablet 11   vitamin C (ASCORBIC ACID) 500 MG tablet Take 500 mg by mouth daily.     albuterol (PROVENTIL HFA;VENTOLIN HFA) 108 (90 Base) MCG/ACT inhaler Inhale 2 puffs into the lungs every 6 (six) hours as needed for wheezing or shortness of breath. (Patient not taking: Reported on 08/25/2021)     azithromycin (ZITHROMAX) 500 MG tablet Take 500 mg by mouth daily. (Patient not taking: Reported on 08/25/2021)     ibuprofen (ADVIL) 200 MG tablet Take 200 mg by mouth as needed. (Patient not taking: Reported on 08/25/2021)     Melatonin 3 MG TABS Take by mouth. (Patient not taking: Reported on 08/25/2021)     ondansetron (ZOFRAN-ODT) 4 MG disintegrating tablet Take 1-2 tablets (4-8 mg total) by mouth every 8 (eight) hours as needed for nausea. May also take with maxalt for migraine (Patient not taking: Reported on 08/25/2021) 30 tablet 3   predniSONE (STERAPRED UNI-PAK 21 TAB) 5 MG (21) TBPK tablet Take by mouth as directed. (Patient not taking: Reported on 08/25/2021)     ranitidine (ZANTAC) 150 MG capsule Take 150 mg by mouth 2 (two) times daily. (Patient not taking: Reported on 08/25/2021)     No  facility-administered medications prior to visit.     Allergies:   Doxycycline and Septra [sulfamethoxazole-trimethoprim]   Social History   Socioeconomic History   Marital status: Married    Spouse name: Not on file   Number of children: Not on file   Years of education: Not on file   Highest education level: Not on file  Occupational History   Not on file  Tobacco Use   Smoking status: Never   Smokeless tobacco: Never  Substance and Sexual Activity   Alcohol use: Yes  Comment: 3 glasses of wine a week.   Drug use: No   Sexual activity: Not on file  Other Topics Concern   Not on file  Social History Narrative   Coffe decaf one cup daily.  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     Social Determinants of Health   Financial Resource Strain: Not on file  Food Insecurity: Not on file  Transportation Needs: Not on file  Physical Activity: Not on file  Stress: Not on file  Social Connections: Not on file    Socially, she was born in [place].  She is married for 31 years and has 4 children.  She works in [employer] at [employer].  She has 2 BS degrees.  There is no history of tobacco use.  She drinks a glass of wine 1 to 2 days/week.  Family History:  The patient's family history includes Asthma in an other family member; Breast cancer in her cousin, maternal aunt, and sister; Cancer in her sister and another family member; Diabetes in an other family member; Hyperlipidemia in an other family member; Migraines in her sister and sister; Stroke in an other family member.   Both parents are deceased, mother died at age 70 and had diabetes, CHF, and kidney failure.  Father died at age 35 and had hypertension, diabetes mellitus, remote CVA, and bladder cancer. She has 1 living brother age 26 who has diabetes and hypertension.  She has 4 sisters ages 63, 10, 56 and 37 with a history of diabetes, ovarian and breast  cancer.  Her children are aged 24, 95, 85 and 60 and her oldest child has asthma.   ROS General: Negative; No fevers, chills, or night sweats;  HEENT: Negative; No changes in vision or hearing, sinus congestion, difficulty swallowing Pulmonary: History of asthma Cardiovascular: See HPI GI: Negative; No nausea, vomiting, diarrhea, or abdominal pain GU: Negative; No dysuria, hematuria, or difficulty voiding Musculoskeletal: Negative; no myalgias, joint pain, or weakness Hematologic/Oncology: Negative; no easy bruising, bleeding Endocrine: Negative; no heat/cold intolerance; no diabetes Neuro: History of migraine headaches Skin: Negative; No rashes or skin lesions Psychiatric: Negative; No behavioral problems, depression Sleep: Negative; No snoring, daytime sleepiness, hypersomnolence, bruxism, restless legs, hypnogognic hallucinations, no cataplexy Other comprehensive 14 point system review is negative.   PHYSICAL EXAM:   VS:  BP 110/70 (BP Location: Left Arm, Patient Position: Sitting, Cuff Size: Normal)   Pulse 69   Ht 5' 7"  (1.702 m)   Wt 173 lb 9.6 oz (78.7 kg)   SpO2 94%   BMI 27.19 kg/m     Repeat blood pressure by me was 112/76 supine and 108/72 standing  Wt Readings from Last 3 Encounters:  08/25/21 173 lb 9.6 oz (78.7 kg)  06/16/21 173 lb 9.6 oz (78.7 kg)  11/21/20 178 lb (80.7 kg)     General: Alert, oriented, no distress.  Skin: normal turgor, no rashes, warm and dry HEENT: Normocephalic, atraumatic. Pupils equal round and reactive to light; sclera anicteric; extraocular muscles intact;  Nose without nasal septal hypertrophy Mouth/Parynx benign; Mallinpatti scale 2 Neck: No JVD, no carotid bruits; normal carotid upstroke Lungs: clear to ausculatation and percussion; no wheezing or rales Chest wall: without tenderness to palpitation Heart: PMI not displaced, RRR, s1 s2 normal, 1/6 systolic murmur, no diastolic murmur, no rubs, gallops, thrills, or heaves Abdomen:  soft, nontender; no hepatosplenomehaly, BS+; abdominal aorta nontender and not dilated by palpation.  Back: no CVA tenderness Pulses 2+ Musculoskeletal: full range of motion, normal strength, no joint deformities Extremities: no clubbing cyanosis or edema, Homan's sign negative  Neurologic: grossly nonfocal; Cranial nerves grossly wnl Psychologic: Normal mood and affect   Studies/Labs Reviewed:   June 16, 2021 ECG (independently read by me): NSR at 73, QTc 462 msec, no ectopy  Recent Labs:    Latest Ref Rng & Units 06/16/2021    4:39 PM 05/26/2010    2:15 PM 12/14/2006   12:30 PM  BMP  Glucose 70 - 99 mg/dL 92  85  85   BUN 6 - 24 mg/dL 13  11  8    Creatinine 0.57 - 1.00 mg/dL 0.69  0.56  0.69   BUN/Creat Ratio 9 - 23 19     Sodium 134 - 144 mmol/L 145  140  137   Potassium 3.5 - 5.2 mmol/L 4.0  3.9  3.8   Chloride 96 - 106 mmol/L 105  104  105   CO2 20 - 29 mmol/L 25  28  25    Calcium 8.7 - 10.2 mg/dL 9.6  9.1  9.1         Latest Ref Rng & Units 05/26/2010    2:15 PM  Hepatic Function  Total Protein 6.0 - 8.3 g/dL 7.3   Albumin 3.5 - 5.2 g/dL 4.4   AST 0 - 37 U/L 16   ALT 0 - 35 U/L 24   Alk Phosphatase 39 - 117 U/L 64   Total Bilirubin 0.3 - 1.2 mg/dL 0.3        Latest Ref Rng & Units 05/26/2010    2:15 PM 12/14/2006   12:30 PM  CBC  WBC 4.0 - 10.5 K/uL 6.7  7.0   Hemoglobin 12.0 - 15.0 g/dL 13.3  12.9   Hematocrit 36.0 - 46.0 % 40.8  37.4   Platelets 150 - 400 K/uL 254  229    Lab Results  Component Value Date   MCV 89.5 05/26/2010   MCV 87.8 12/14/2006   No results found for: "TSH" No results found for: "HGBA1C"   BNP No results found for: "BNP"  ProBNP No results found for: "PROBNP"   Lipid Panel  No results found for: "CHOL", "TRIG", "HDL", "CHOLHDL", "VLDL", "LDLCALC", "LDLDIRECT", "LABVLDL"   RADIOLOGY: No results found.   Additional studies/ records that were reviewed today include:   I reviewed the records of [facility]  [facility].   ASSESSMENT:    1. Pre-syncope   2. Migraine without aura and without status migrainosus, not intractable   3. Mild intermittent asthma without complication   4. Mixed hyperlipidemia     PLAN:  Meredith Blackwell is a very pleasant 56 year old female has a history of migraine headaches, followed by Dr. Vivi Martens as well as a history of asthma.  She recently had noticed episodes of some mild dizziness associated with headache which became progressive and has had 2 episodes of presyncope where she felt unstable had transient vision dizziness which resolved spontaneously.  These are unassociated with any awareness of chest tightness or pressure or any awareness of arrhythmia.  When I saw her for my initial evaluation her blood pressure was stable and there was no orthostatic hypotension.  Her ECG revealed a stable cardiac rhythm and  at 73 and QTc interval 463 without ectopy.  She has had some issues with left leg discomfort.  Laboratory in May did reveal a potassium of 3.2.  She had minimal ALT elevation  at 57 with normal AST at 22.  Subsequent electrolytes were normal.  I reviewed her imaging studies which reveal essentially normal carotid ultrasound although very minimal "near normal "plaque was noted in the left carotid.  She had normal vertebral and subclavian flow.  Her 2D echo Doppler study showed hyperdynamic LV function with EF at 65 to 70% with normal diastolic function and strain.  There was mild mitral regurgitation.  Her Zio patch monitor did not reveal any significant rhythm abnormality.  Her blood pressure today is stable on current therapy without orthostatic change.  Clinically she feels well.  I reviewed her most recent laboratory done by Dr. Abner Greenspan.  We discussed the importance of improved diet and discussed at length the heart healthy Mediterranean diet.  She will be undergoing a coronary calcium score which will be ordered by Dr. Haynes Blackwell in light of her LDL cholesterol.  If coronary  calcification is present, I would recommend achieving target LDL less than 70 with initiation of lipid-lowering therapy.  She has prediabetes with hemoglobin A1c most recently at 6.1.  I will see her in 1 year for follow-up evaluation or sooner as needed.   Medication Adjustments/Labs and Tests Ordered: Current medicines are reviewed at length with the patient today.  Concerns regarding medicines are outlined above.  Medication changes, Labs and Tests ordered today are listed in the Patient Instructions below. Patient Instructions  Medication Instructions:  Your physician recommends that you continue on your current medications as directed. Please refer to the Current Medication list given to you today.  *If you need a refill on your cardiac medications before your next appointment, please call your pharmacy*  Follow-Up: At [facility], you and your health needs are our priority.  As part of our continuing mission to provide you with exceptional heart care, we have created designated Provider Care Teams.  These Care Teams include your primary Cardiologist (physician) and Advanced Practice Providers (APPs -  Physician Assistants and Nurse Practitioners) who all work together to provide you with the care you need, when you need it.  We recommend signing up for the patient portal called "MyChart".  Sign up information is provided on this After Visit Summary.  MyChart is used to connect with patients for Virtual Visits (Telemedicine).  Patients are able to view lab/test results, encounter notes, upcoming appointments, etc.  Non-urgent messages can be sent to your provider as well.   To learn more about what you can do with MyChart, go to [email or web address].    Your next appointment:   12 month(s)  The format for your next appointment:   In Person  Provider:   Shelva Majestic, MD {          Signed, Meredith Majestic, MD  08/25/2021 6:13 PM    [facility] [address] [place], [place], [place]  [address] Phone: [phone]

## 2021-08-25 NOTE — Patient Instructions (Signed)
Medication Instructions:  Your physician recommends that you continue on your current medications as directed. Please refer to the Current Medication list given to you today.  *If you need a refill on your cardiac medications before your next appointment, please call your pharmacy*  Follow-Up: At [facility], you and your health needs are our priority.  As part of our continuing mission to provide you with exceptional heart care, we have created designated Provider Care Teams.  These Care Teams include your primary Cardiologist (physician) and Advanced Practice Providers (APPs -  Physician Assistants and Nurse Practitioners) who all work together to provide you with the care you need, when you need it.  We recommend signing up for the patient portal called "MyChart".  Sign up information is provided on this After Visit Summary.  MyChart is used to connect with patients for Virtual Visits (Telemedicine).  Patients are able to view lab/test results, encounter notes, upcoming appointments, etc.  Non-urgent messages can be sent to your provider as well.   To learn more about what you can do with MyChart, go to [email or web address].    Your next appointment:   12 month(s)  The format for your next appointment:   In Person  Provider:   Dickison Kelly, MD      

## 2021-09-16 ENCOUNTER — Encounter: Payer: Self-pay | Admitting: Neurology

## 2021-09-16 NOTE — Telephone Encounter (Signed)
Called pt, she accepted appt with Amy on 09/17/21 at 10:30.

## 2021-09-17 ENCOUNTER — Ambulatory Visit (INDEPENDENT_AMBULATORY_CARE_PROVIDER_SITE_OTHER): Payer: BC Managed Care – PPO | Admitting: Family Medicine

## 2021-09-17 ENCOUNTER — Encounter: Payer: Self-pay | Admitting: Family Medicine

## 2021-09-17 ENCOUNTER — Telehealth: Payer: Self-pay | Admitting: Neurology

## 2021-09-17 DIAGNOSIS — G43711 Chronic migraine without aura, intractable, with status migrainosus: Secondary | ICD-10-CM | POA: Diagnosis not present

## 2021-09-17 MED ORDER — QULIPTA 60 MG PO TABS: 60.0000 mg | ORAL_TABLET | Freq: Every day | ORAL | 3 refills | Status: DC

## 2021-09-17 MED ORDER — QULIPTA 60 MG PO TABS: 60.0000 mg | ORAL_TABLET | Freq: Every day | ORAL | 0 refills | Status: DC

## 2021-09-17 MED ORDER — RIZATRIPTAN BENZOATE 10 MG PO TBDP: 10.0000 mg | ORAL_TABLET | ORAL | 11 refills | Status: DC | PRN

## 2021-09-17 NOTE — Telephone Encounter (Signed)
PA completed on CMM/[employer] KEY: [id number] Awaiting questions on CMM

## 2021-09-17 NOTE — Patient Instructions (Signed)
Below is our plan:  We will stop Emgality. Start Qulipta '60mg'$  daily. Continue Nurtec and or rizatriptan as needed. You can use Tylenol and Ibuprofen or Aleve with Nurtec as well. Sometimes Benadryl is good, especially for weather related headaches.   Please make sure you are staying well hydrated. I recommend 50-60 ounces daily. Well balanced diet and regular exercise encouraged. Consistent sleep schedule with 6-8 hours recommended.   Please continue follow up with care team as directed.   Follow up with me in 6 months   You may receive a survey regarding today's visit. I encourage you to leave honest feed back as I do use this information to improve patient care. Thank you for seeing me today!

## 2021-09-17 NOTE — Progress Notes (Signed)
Chief Complaint  Patient presents with   Follow-up    RM 2, alone. Last seen 02/24/21. Migraine f/u.     HISTORY OF PRESENT ILLNESS:  09/17/21 ALL:  Meredith Blackwell is a 56 y.o. female here today for follow up for migraines. She continues Emgality monthly, gabapentin 389m QHS and Nurtec or rizatriptan PRN. She reports that she was doing really well until late July. [place] that triggered a migraine. She has had some sort of headache since. She reports at least 8-12 migraine days over the past month. Nurtec works best with rizatriptan. She is having intermittent dizziness. PCP sent her to cardiology. Workup has been unremarkable. She feels that she was doing better on Ajovy.   [id number]:[id number] 02/2023  HISTORY (copied from Dr ACathren Laineprevious note)  02/24/2021: Patient is doing better, only 4 migraines a month, rizatriptan helps but makes her grogy, on emgality. Her daughter likes AArie Sabina maxalt makes her tired, we discussed using nurtec which also helps, can use it acutely on onset but also in a preventative manner (1/2 life is 11-12 hours so if feel a migriane may come on can take it) we discussed options and I answered questions (see plan below). She wants to stay on emgality, use maxalt and nurtec.  HPI:  Meredith ALMANZARis a 56y.o. female here as requested by PCrist Infante MD for migraines since the 4th grade. PMHx anemia, asthma, environmental and seasonal allergies, GERD, glaucoma, breast lumpectomy, back surgery, never smoked, appendectomy, iron deficiency, hyperlipidemia, depression, tinnitus, acute maxillary sinusitis, prediabetes. Her daughters CAlanson Pulsand SOverton Mamare patients here for migraines as well.   She has daily headaches, twice monthly migraines. Started worsening earlier this year and would last for days. Started in 4th grade. Migraines on the left side of her face, nausea, she can have numbness in the face before  migraines rarely, throbbing,light sensitivity, they can make her feel very badly, movement makes it worse, can also happen on the right side in fact she has had a headache on the right for 3 days now, behind the eye and radiating, can be the whole head, wakes up with headaches, she is tired but doesn't sleep well (consider a sleep study), she was taking a lot of advil and stopped doing that, no aura often, she doesn't remember a time not having headaches. Daily headaches for years. 8 moderately to severe migraine days a month that last up to 24 hours. Worsening. Has vision issues, floaters and blurry vision, sitting in a dark room helps, she doesn;t want to get out of bed some days. No other focal neurologic deficits, associated symptoms, inciting events or modifiable factors.  Reviewed notes, labs and imaging from outside physicians, which showed:  Per Dr. PTempie Donningnotes, she sees Dr. BMaxie Betterand Ortho, Dr. RDewitt Rota Dr. CMidge Avereye, Dr. GTressia DanasGYN, Dr. HIshmael Holterallergy, Dr. WClydene Lamingdermatology, Dr. OAlethia Bertholdoptometry, Dr. CBrantley Stageand surgery.  She is on sumatriptan hand.  Patient saw ophthalmology in 2014, for glaucoma evaluation and possibly complications from asthma meds, history of narrow angle glaucoma, uncontrolled asthma, has tried Singulair Zyrtec ProAir Symbicort and Nasonex. Amitriptyline, maxalt, aimovig contraindicated due to constipation  Labs reviewed from June 12, 2020 included unremarkable CMP with BUN 16 and creatinine 0.6, CBC from Jun 04, 2020 was normal TSH also from Jun 04, 2020 was normal, hemoglobin A1c in Jun 04, 2020 was 6.3.  I  reviewed Dr. Tempie Donning examination which was normal including eyes, ears, neck, skin, heart, lungs, abdomen.  From a thorough review of records, medications tried that can be used in migraine and headache management include: Ibuprofen, Tylenol, gabapentin, melatonin, Imitrex, propranolol contraindicated due to asthma, Topamax contraindicated due to  glaucoma, at the time her exam was 20/20 right and 20/20 left, pressure was 20 in the right and 20 in the left, pupils were equally round and reactive to light, visual fields were full, extraocular movements were full, slit-lamp and funduscopic exam were normal, diagnosed with open angle with borderline findings, bilateral, low risk angle-closure, all meds should be okay, okay to take needed asthma medication.  But continue observation.   REVIEW OF SYSTEMS: Out of a complete 14 system review of symptoms, the patient complains only of the following symptoms, headaches, dizziness and all other reviewed systems are negative.   ALLERGIES: Allergies  Allergen Reactions   Doxycycline    Septra [Sulfamethoxazole-Trimethoprim]      HOME MEDICATIONS: Outpatient Medications Prior to Visit  Medication Sig Dispense Refill   acetaminophen (TYLENOL) 500 MG tablet Take 500 mg by mouth every 6 (six) hours as needed.     albuterol (PROVENTIL HFA;VENTOLIN HFA) 108 (90 Base) MCG/ACT inhaler Inhale 2 puffs into the lungs every 6 (six) hours as needed for wheezing or shortness of breath.     budesonide-formoterol (SYMBICORT) 160-4.5 MCG/ACT inhaler Inhale 2 puffs into the lungs 2 (two) times daily.     cetirizine (ZYRTEC) 10 MG tablet Take 10 mg by mouth daily.     Cholecalciferol 50 MCG (2000 UT) CAPS Take 2,000 Units by mouth daily.     famotidine (PEPCID) 20 MG tablet Take 20 mg by mouth 2 (two) times daily.     fluticasone (FLONASE) 50 MCG/ACT nasal spray Place into both nostrils daily.     gabapentin (NEURONTIN) 300 MG capsule Take 1 capsule (300 mg total) by mouth at bedtime. 90 capsule 1   Galcanezumab-gnlm (EMGALITY) 120 MG/ML SOAJ Inject 120 mg into the skin every 30 (thirty) days. 1.12 mL 11   ibuprofen (ADVIL) 200 MG tablet Take 200 mg by mouth as needed.     Melatonin 3 MG TABS Take by mouth.     montelukast (SINGULAIR) 10 MG tablet Take 10 mg by mouth at bedtime.     ondansetron (ZOFRAN-ODT) 4  MG disintegrating tablet Take 1-2 tablets (4-8 mg total) by mouth every 8 (eight) hours as needed for nausea. May also take with maxalt for migraine 30 tablet 3   pantoprazole (PROTONIX) 40 MG tablet pantoprazole 40 mg tablet,delayed release  TAKE 1 TABLET BY MOUTH EVERY DAY     raloxifene (EVISTA) 60 MG tablet raloxifene 60 mg tablet  TAKE 1 TABLET BY MOUTH ONCE DAILY     Rimegepant Sulfate (NURTEC) 75 MG TBDP Take 75 mg by mouth daily as needed. For migraines. Take as close to onset of migraine as possible. One daily maximum. 16 tablet 11   vitamin C (ASCORBIC ACID) 500 MG tablet Take 500 mg by mouth daily.     rizatriptan (MAXALT-MLT) 10 MG disintegrating tablet Take 1 tablet (10 mg total) by mouth as needed for migraine. May repeat in 2 hours if needed 9 tablet 11   azithromycin (ZITHROMAX) 500 MG tablet Take 500 mg by mouth daily. (Patient not taking: Reported on 08/25/2021)     predniSONE (STERAPRED UNI-PAK 21 TAB) 5 MG (21) TBPK tablet Take by mouth as directed. (Patient not taking: Reported  on 08/25/2021)     pseudoephedrine (SUDAFED) 30 MG tablet Take 30 mg by mouth every 4 (four) hours as needed for congestion.     No facility-administered medications prior to visit.     PAST MEDICAL HISTORY: Past Medical History:  Diagnosis Date   Abdominal pain    Anal spasm    Anemia    Asthma    Constipation    Diarrhea    Dysphagia    Environmental and seasonal allergies    Family history of bladder cancer    Family history of breast cancer    Family history of melanoma    Family history of ovarian cancer    GERD (gastroesophageal reflux disease)    Glaucoma    Hemorrhoid    Nausea      PAST SURGICAL HISTORY: Past Surgical History:  Procedure Laterality Date   ABDOMINAL HYSTERECTOMY     pt says hysterectomy was vaginal   APPENDECTOMY     BACK SURGERY     BREAST EXCISIONAL BIOPSY     BREAST LUMPECTOMY     NOSE SURGERY     SHOULDER SURGERY     TONSILLECTOMY     WISDOM  TOOTH EXTRACTION       FAMILY HISTORY: Family History  Problem Relation Age of Onset   Breast cancer Sister    Cancer Sister    Migraines Sister    Migraines Sister    Breast cancer Maternal Aunt    Breast cancer Cousin    Diabetes Other    Hyperlipidemia Other    Stroke Other    Cancer Other    Asthma Other    Colon cancer Neg Hx    Esophageal cancer Neg Hx    Rectal cancer Neg Hx    Stomach cancer Neg Hx      SOCIAL HISTORY: Social History   Socioeconomic History   Marital status: Married    Spouse name: Not on file   Number of children: Not on file   Years of education: Not on file   Highest education level: Not on file  Occupational History   Not on file  Tobacco Use   Smoking status: Never   Smokeless tobacco: Never  Substance and Sexual Activity   Alcohol use: Yes    Comment: 3 glasses of wine a week.   Drug use: No   Sexual activity: Not on file  Other Topics Concern   Not on file  Social History Narrative   Coffe decaf one cup daily.  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     Social Determinants of Health   Financial Resource Strain: Not on file  Food Insecurity: Not on file  Transportation Needs: Not on file  Physical Activity: Not on file  Stress: Not on file  Social Connections: Not on file  Intimate Partner Violence: Not on file     PHYSICAL EXAM  Vitals:   09/17/21 1028 09/17/21 1057  BP: (!) 151/92 120/78  Pulse: 76   Weight: 171 lb 6.4 oz (77.7 kg)   Height: 5' 7" (1.702 m)    Body mass index is 26.85 kg/m.  Generalized: Well developed, in no acute distress  Cardiology: normal rate and rhythm, no murmur auscultated  Respiratory: clear to auscultation bilaterally    Neurological examination  Mentation: Alert oriented to time, place, history taking. Follows all commands speech and language fluent Cranial nerve II-XII: Pupils were equal round reactive  to light. Extraocular movements were  full, visual field were full on confrontational test. Facial sensation and strength were normal. Uvula tongue midline. Head turning and shoulder shrug  were normal and symmetric. Motor: The motor testing reveals 5 over 5 strength of all 4 extremities. Good symmetric motor tone is noted throughout.  Sensory: Sensory testing is intact to soft touch on all 4 extremities. No evidence of extinction is noted.  Coordination: Cerebellar testing reveals good finger-nose-finger and heel-to-shin bilaterally.  Gait and station: Gait is normal. Tandem gait is normal. Romberg is negative. No drift is seen.  Reflexes: Deep tendon reflexes are symmetric and normal bilaterally.    DIAGNOSTIC DATA (LABS, IMAGING, TESTING) - I reviewed patient records, labs, notes, testing and imaging myself where available.  Lab Results  Component Value Date   WBC 6.7 05/26/2010   HGB 13.3 05/26/2010   HCT 40.8 05/26/2010   MCV 89.5 05/26/2010   PLT 254 05/26/2010      Component Value Date/Time   NA 145 (H) 06/16/2021 1639   K 4.0 06/16/2021 1639   CL 105 06/16/2021 1639   CO2 25 06/16/2021 1639   GLUCOSE 92 06/16/2021 1639   GLUCOSE 85 05/26/2010 1415   BUN 13 06/16/2021 1639   CREATININE 0.69 06/16/2021 1639   CALCIUM 9.6 06/16/2021 1639   PROT 7.3 05/26/2010 1415   ALBUMIN 4.4 05/26/2010 1415   AST 16 05/26/2010 1415   ALT 24 05/26/2010 1415   ALKPHOS 64 05/26/2010 1415   BILITOT 0.3 05/26/2010 1415   GFRNONAA >60 05/26/2010 1415   GFRAA  05/26/2010 1415    >60        The eGFR has been calculated using the MDRD equation. This calculation has not been validated in all clinical situations. eGFR's persistently <60 mL/min signify possible Chronic Kidney Disease.   No results found for: "CHOL", "HDL", "LDLCALC", "LDLDIRECT", "TRIG", "CHOLHDL" No results found for: "HGBA1C" No results found for: "VITAMINB12" No results found for: "TSH"      No data to display               No data to display            ASSESSMENT AND PLAN  56 y.o. year old female  has a past medical history of Abdominal pain, Anal spasm, Anemia, Asthma, Constipation, Diarrhea, Dysphagia, Environmental and seasonal allergies, Family history of bladder cancer, Family history of breast cancer, Family history of melanoma, Family history of ovarian cancer, GERD (gastroesophageal reflux disease), Glaucoma, Hemorrhoid, and Nausea. here with    Chronic migraine without aura, with intractable migraine, so stated, with status migrainosus - Plan: rizatriptan (MAXALT-MLT) 10 MG disintegrating tablet  Kinzlee HENDY BRINDLE reports worsening headaches over the past month. We will switch Emgality to [place]. Sample tablets (16) provided in office and rx called to pharmacy. Side effects reviewed. She may consider taking Colace if needed for constipation. She will continue Nurtec and or rizatriptan for abortive therapy. She will continue to montior dizziness. MRI normal 12/2020. She does have significant seasonal allergies, typically worse in the fall. Healthy lifestyle habits encouraged. She will follow up with PCP as directed. She will return to see me in 6 months, sooner if needed. She verbalizes understanding and agreement with this plan.   No orders of the defined types were placed in this encounter.    Meds ordered this encounter  Medications   Atogepant (QULIPTA) 60 MG TABS    Sig: Take 60 mg by mouth daily.  Dispense:  90 tablet    Refill:  3    Order Specific Question:   Supervising Provider    Answer:   Melvenia Beam [[id number]]   rizatriptan (MAXALT-MLT) 10 MG disintegrating tablet    Sig: Take 1 tablet (10 mg total) by mouth as needed for migraine. May repeat in 2 hours if needed    Dispense:  9 tablet    Refill:  11    Order Specific Question:   Supervising Provider    Answer:   Melvenia Beam [[id number]]   Atogepant (QULIPTA) 60 MG TABS    Sig: Take 60 mg by mouth daily.    Dispense:  16 tablet    Refill:  0     Order Specific Question:   Supervising Provider    Answer:   Melvenia Beam [[id number]]     Debbora Presto, MSN, FNP-C 09/17/2021, 11:28 AM  [facility] [address], [place], [place] [address] [phone]

## 2021-09-18 NOTE — Telephone Encounter (Signed)
PA approved 09/18/2021 to 12/17/2021. PA# [place] [facility] [id number]

## 2021-09-18 NOTE — Telephone Encounter (Signed)
Questions completed and sent to plan for the patient.

## 2021-10-06 ENCOUNTER — Other Ambulatory Visit: Payer: Self-pay | Admitting: Neurology

## 2021-10-06 DIAGNOSIS — M5416 Radiculopathy, lumbar region: Secondary | ICD-10-CM

## 2021-10-06 DIAGNOSIS — N951 Menopausal and female climacteric states: Secondary | ICD-10-CM

## 2021-10-24 ENCOUNTER — Ambulatory Visit
Admission: RE | Admit: 2021-10-24 | Discharge: 2021-10-24 | Disposition: A | Discharge status: Home / Self Care | Payer: No Typology Code available for payment source | Source: Ambulatory Visit | Attending: Internal Medicine | Admitting: Internal Medicine

## 2021-10-24 DIAGNOSIS — E785 Hyperlipidemia, unspecified: Secondary | ICD-10-CM

## 2021-11-11 DIAGNOSIS — R4182 Altered mental status, unspecified: Secondary | ICD-10-CM | POA: Diagnosis not present

## 2021-11-11 DIAGNOSIS — R7301 Impaired fasting glucose: Secondary | ICD-10-CM | POA: Diagnosis not present

## 2021-11-18 ENCOUNTER — Telehealth: Payer: Self-pay | Admitting: Neurology

## 2021-11-18 NOTE — Telephone Encounter (Signed)
Received this response on CMM Your PA has been resolved, no additional PA is required.

## 2021-11-18 NOTE — Telephone Encounter (Signed)
PA completed on CMM/[id number] Awaiting questions

## 2021-12-15 ENCOUNTER — Other Ambulatory Visit: Payer: Self-pay | Admitting: Neurology

## 2021-12-15 DIAGNOSIS — G43711 Chronic migraine without aura, intractable, with status migrainosus: Secondary | ICD-10-CM

## 2021-12-23 ENCOUNTER — Telehealth: Payer: Self-pay | Admitting: *Deleted

## 2021-12-23 NOTE — Telephone Encounter (Signed)
Initiated PA [employer] on covermymeds. Key: [id number]. Waiting on questions to become available to proceed with PA.

## 2021-12-24 NOTE — Telephone Encounter (Signed)
Received fax from [place] that [place] approved 12/13/23012/13/24. PA# [place] [facility] [id number]

## 2021-12-24 NOTE — Telephone Encounter (Signed)
Submitted PA. Waiting on determination from [place].

## 2022-01-06 ENCOUNTER — Encounter: Payer: Self-pay | Admitting: Family Medicine

## 2022-01-07 MED ORDER — INDOMETHACIN 25 MG PO CAPS: 25.0000 mg | ORAL_CAPSULE | Freq: Three times a day (TID) | ORAL | 1 refills | Status: AC | PRN

## 2022-01-13 ENCOUNTER — Other Ambulatory Visit: Payer: Self-pay | Admitting: Internal Medicine

## 2022-01-13 DIAGNOSIS — Z1231 Encounter for screening mammogram for malignant neoplasm of breast: Secondary | ICD-10-CM

## 2022-03-05 ENCOUNTER — Ambulatory Visit
Admission: RE | Admit: 2022-03-05 | Discharge: 2022-03-05 | Disposition: A | Discharge status: Home / Self Care | Payer: BC Managed Care – PPO | Source: Ambulatory Visit | Attending: Internal Medicine | Admitting: Internal Medicine

## 2022-03-05 DIAGNOSIS — D229 Melanocytic nevi, unspecified: Secondary | ICD-10-CM | POA: Diagnosis not present

## 2022-03-05 DIAGNOSIS — E785 Hyperlipidemia, unspecified: Secondary | ICD-10-CM | POA: Diagnosis not present

## 2022-03-05 DIAGNOSIS — F329 Major depressive disorder, single episode, unspecified: Secondary | ICD-10-CM | POA: Diagnosis not present

## 2022-03-05 DIAGNOSIS — Z1231 Encounter for screening mammogram for malignant neoplasm of breast: Secondary | ICD-10-CM

## 2022-03-05 DIAGNOSIS — G43909 Migraine, unspecified, not intractable, without status migrainosus: Secondary | ICD-10-CM | POA: Diagnosis not present

## 2022-03-05 DIAGNOSIS — R7301 Impaired fasting glucose: Secondary | ICD-10-CM | POA: Diagnosis not present

## 2022-03-06 ENCOUNTER — Other Ambulatory Visit: Payer: Self-pay | Admitting: Internal Medicine

## 2022-03-06 DIAGNOSIS — N6019 Diffuse cystic mastopathy of unspecified breast: Secondary | ICD-10-CM

## 2022-03-09 ENCOUNTER — Other Ambulatory Visit: Payer: Self-pay | Admitting: Neurology

## 2022-03-09 DIAGNOSIS — G43009 Migraine without aura, not intractable, without status migrainosus: Secondary | ICD-10-CM

## 2022-03-17 NOTE — Patient Instructions (Signed)
Below is our plan:  We will continue Qulipta 60mg  daily. Continue Nurtec 75mg  daily as needed, rizatriptan 10mg  daily as needed, indomethacin for intractable headaches and ondansetron as needed for nausea.   Consider Botox if headaches worsen.   Please make sure you are staying well hydrated. I recommend 50-60 ounces daily. Well balanced diet and regular exercise encouraged. Consistent sleep schedule with 6-8 hours recommended.   Please continue follow up with care team as directed.   Follow up with me in 1 year   You may receive a survey regarding today's visit. I encourage you to leave honest feed back as I do use this information to improve patient care. Thank you for seeing me today!
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
## 2022-03-18 ENCOUNTER — Telehealth: Payer: Self-pay

## 2022-03-18 ENCOUNTER — Ambulatory Visit (INDEPENDENT_AMBULATORY_CARE_PROVIDER_SITE_OTHER): Payer: BC Managed Care – PPO | Admitting: Family Medicine

## 2022-03-18 ENCOUNTER — Encounter: Payer: Self-pay | Admitting: Family Medicine

## 2022-03-18 VITALS — BP 123/87 | HR 87 | Ht 67.0 in | Wt 184.5 lb

## 2022-03-18 DIAGNOSIS — G43711 Chronic migraine without aura, intractable, with status migrainosus: Secondary | ICD-10-CM | POA: Diagnosis not present

## 2022-03-18 NOTE — Telephone Encounter (Signed)
Called pharmacy about pt's medication for Nurtec to see if it needed a PA and was told that the PA was sent to CoverMyMeds for [place] to do. Told pharmacy we will get it done for the pt.

## 2022-03-27 ENCOUNTER — Other Ambulatory Visit: Payer: Self-pay | Admitting: Neurology

## 2022-03-27 DIAGNOSIS — G43009 Migraine without aura, not intractable, without status migrainosus: Secondary | ICD-10-CM

## 2022-03-30 ENCOUNTER — Telehealth: Payer: Self-pay | Admitting: *Deleted

## 2022-03-30 ENCOUNTER — Other Ambulatory Visit (HOSPITAL_COMMUNITY): Payer: Self-pay

## 2022-03-30 NOTE — Telephone Encounter (Signed)
Pharmacy Patient Advocate Encounter   Received notification from [place] that prior authorization for Nurtec 75MG  dispersible tablets is required/requested.   PA submitted on 03/30/2022 to (ins) [employer] via [employer] or (Medicaid) confirmation # [id number]  Status is pending

## 2022-03-30 NOTE — Telephone Encounter (Addendum)
   Please route replies back to pod 1

## 2022-03-30 NOTE — Telephone Encounter (Signed)
PA message sent to prior auth team to start PA.

## 2022-03-31 NOTE — Telephone Encounter (Signed)
Pharmacy Patient Advocate Encounter  Received notification from [facility] [employer] that the request for prior authorization for Nurtec ODT 75mg  has been denied due to See below.       Please be advised we currently do not have a Pharmacist to review denials, therefore you will need to process appeals accordingly as needed. Thanks for your support at this time. The denial letter has been scanned into chart under the media tab.   You may call See below or fax see below, to appeal.

## 2022-04-01 DIAGNOSIS — R945 Abnormal results of liver function studies: Secondary | ICD-10-CM | POA: Diagnosis not present

## 2022-04-01 DIAGNOSIS — E785 Hyperlipidemia, unspecified: Secondary | ICD-10-CM | POA: Diagnosis not present

## 2022-04-01 DIAGNOSIS — R7301 Impaired fasting glucose: Secondary | ICD-10-CM | POA: Diagnosis not present

## 2022-04-20 ENCOUNTER — Other Ambulatory Visit: Payer: Self-pay | Admitting: Internal Medicine

## 2022-04-20 DIAGNOSIS — N951 Menopausal and female climacteric states: Secondary | ICD-10-CM

## 2022-04-20 DIAGNOSIS — M5416 Radiculopathy, lumbar region: Secondary | ICD-10-CM

## 2022-04-23 ENCOUNTER — Other Ambulatory Visit: Payer: Self-pay | Admitting: Neurology

## 2022-04-23 DIAGNOSIS — M5416 Radiculopathy, lumbar region: Secondary | ICD-10-CM

## 2022-04-23 DIAGNOSIS — N951 Menopausal and female climacteric states: Secondary | ICD-10-CM

## 2022-04-27 ENCOUNTER — Other Ambulatory Visit: Payer: Self-pay

## 2022-04-27 DIAGNOSIS — G43009 Migraine without aura, not intractable, without status migrainosus: Secondary | ICD-10-CM

## 2022-04-27 MED ORDER — NURTEC 75 MG PO TBDP: ORAL_TABLET | ORAL | 0 refills | Status: DC

## 2022-04-28 ENCOUNTER — Other Ambulatory Visit: Payer: Self-pay

## 2022-04-28 ENCOUNTER — Encounter: Payer: Self-pay | Admitting: Family Medicine

## 2022-04-28 ENCOUNTER — Other Ambulatory Visit: Payer: Self-pay | Admitting: Family Medicine

## 2022-04-28 DIAGNOSIS — M5416 Radiculopathy, lumbar region: Secondary | ICD-10-CM

## 2022-04-28 DIAGNOSIS — N951 Menopausal and female climacteric states: Secondary | ICD-10-CM

## 2022-04-28 DIAGNOSIS — G43009 Migraine without aura, not intractable, without status migrainosus: Secondary | ICD-10-CM

## 2022-04-29 MED ORDER — NURTEC 75 MG PO TBDP: ORAL_TABLET | ORAL | 0 refills | Status: DC

## 2022-04-29 NOTE — Telephone Encounter (Signed)
Patient last seen on 03/18/22 Per Dr.Ahern note on 02/24/21 "Gabapentin may help with radiculopathy and hot flashes, she already takes it sometimes, I can refill it, take it at bedtime may help. This is improved "  Follow up on 03/18/23  Rx sent.

## 2022-05-25 ENCOUNTER — Telehealth: Payer: Self-pay | Admitting: *Deleted

## 2022-05-25 DIAGNOSIS — H40033 Anatomical narrow angle, bilateral: Secondary | ICD-10-CM | POA: Diagnosis not present

## 2022-05-25 NOTE — Telephone Encounter (Addendum)
Submitted PA Nurtec on coverymymeds. Key: [id number]. Waiting on determination from [employer].

## 2022-05-26 ENCOUNTER — Ambulatory Visit (INDEPENDENT_AMBULATORY_CARE_PROVIDER_SITE_OTHER): Payer: BC Managed Care – PPO | Admitting: Allergy and Immunology

## 2022-05-26 VITALS — BP 128/68 | HR 84 | Temp 98.2°F | Resp 18 | Ht 65.75 in | Wt 182.2 lb

## 2022-05-26 DIAGNOSIS — K219 Gastro-esophageal reflux disease without esophagitis: Secondary | ICD-10-CM

## 2022-05-26 DIAGNOSIS — J301 Allergic rhinitis due to pollen: Secondary | ICD-10-CM

## 2022-05-26 DIAGNOSIS — J4520 Mild intermittent asthma, uncomplicated: Secondary | ICD-10-CM

## 2022-05-26 DIAGNOSIS — J3089 Other allergic rhinitis: Secondary | ICD-10-CM

## 2022-05-26 DIAGNOSIS — G43909 Migraine, unspecified, not intractable, without status migrainosus: Secondary | ICD-10-CM | POA: Diagnosis not present

## 2022-05-26 DIAGNOSIS — G4720 Circadian rhythm sleep disorder, unspecified type: Secondary | ICD-10-CM

## 2022-05-26 MED ORDER — AIRSUPRA 90-80 MCG/ACT IN AERO: 2.0000 | INHALATION_SPRAY | RESPIRATORY_TRACT | 1 refills | Status: DC | PRN

## 2022-05-26 MED ORDER — CYPROHEPTADINE HCL 4 MG PO TABS: 2.0000 mg | ORAL_TABLET | Freq: Every evening | ORAL | 5 refills | Status: DC

## 2022-05-26 MED ORDER — PANTOPRAZOLE SODIUM 40 MG PO TBEC: 40.0000 mg | DELAYED_RELEASE_TABLET | Freq: Two times a day (BID) | ORAL | 1 refills | Status: DC

## 2022-05-26 MED ORDER — ASMANEX (60 METERED DOSES) 220 MCG/ACT IN AEPB: 1.0000 | INHALATION_SPRAY | Freq: Every day | RESPIRATORY_TRACT | 5 refills | Status: DC

## 2022-05-26 MED ORDER — FAMOTIDINE 40 MG PO TABS: 40.0000 mg | ORAL_TABLET | Freq: Every morning | ORAL | 1 refills | Status: DC

## 2022-05-26 MED ORDER — RYALTRIS 665-25 MCG/ACT NA SUSP: 2.0000 | Freq: Two times a day (BID) | NASAL | 5 refills | Status: DC

## 2022-05-26 NOTE — Telephone Encounter (Signed)
Received fax from [employer] that PA Nurtec approved 05/26/22-05/26/23. PA# [place] [facility] [id number]

## 2022-05-26 NOTE — Telephone Encounter (Signed)
Received fax from [facility] requesting further questions be answered. Faxed completed/signed form back to them at [phone]. Received fax confirmation, waiting on determination.

## 2022-05-26 NOTE — Patient Instructions (Addendum)
  1. Treat and prevent inflammation of airway:   A. Ryaltris - 2 inhalations 1-2 times per day  B. Asmanex 220 -twisthaler - 1 inhalation 1 time per day  2. Treat and prevent reflux induced respiratory inflammation:   A. Minimize caffeine consumption  B. Replace throat clearing with swallowing/drinking maneuver  C. Increase pantoprazole 40 mg - 1 tablet 2 times per day  D. Famotidine 40 mg - 1 tablet 1 time per day   3. Treat and prevent headache / sleep dysfunction:   A. Minimize caffeine consumption  B. Periactin 4 mg - 1/2-1 tablet at bedtime  C. Consider using Botox  4. If needed:   A. AirSupra - 2 inhalations every 4-6 hours  B. Antihistamine   5. Montelukast ???  6. Return to clinic in 4 weeks or earlier if problem

## 2022-05-26 NOTE — Progress Notes (Signed)
[place] - [place] - [place] - [place] - [place]   Dear Dr. Waynard Edwards,  Thank you for referring Meredith Blackwell to the [facility] [place] on 05/26/2022.   Below is a summation of this patient's evaluation and recommendations.  Thank you for your referral. I will keep you informed about this patient's response to treatment.   If you have any questions please do not hesitate to contact me.   Sincerely,  Jessica Priest, MD Allergy / Immunology [facility]   ______________________________________________________________________    NEW PATIENT NOTE  Referring Provider: Rodrigo Ran, MD Primary Provider: Rodrigo Ran, MD Date of office visit: 05/26/2022    Subjective:   Chief Complaint:  Meredith Blackwell (DOB: [birth year]) is a 57 y.o. female who presents to the clinic on 05/26/2022 with a chief complaint of Asthma (Says she was sent by her PCP as she may be able to qualify for biologics. ) .     HPI: Meredith Blackwell presents to this clinic in evaluation of several issues.  Apparently I saw her in this clinic many years ago for an issue tied up with atopic respiratory disease and LPR.  She still continues to have a cough and some throat clearing.  This can sometimes be triggered at conclusion of exercise, cold air exposure, after eating, while singing.  She rarely has any chest tightness or shortness of breath.  Sometimes when she uses a short acting bronchodilator it does appear to help.  She does not use Symbicort on a regular basis.  In the past she was skin tested and apparently been deemed to be allergic to cats and dust mite and possibly pollen.    She does have reflux which is intermittently active even while utilizing a proton pump inhibitor and H2 receptor blocker.  She drinks 1 caffeinated drink per day and rarely has chocolate and has alcohol on the weekends.  She apparently was evaluated by ENT about 4  years ago.  She has some nasal congestion and sneezing.  When she uses Flonase it does help somewhat.  She has headaches on a daily basis.  She is seeing Dr. Daisy Blossom for migraine headaches and is on various preventative medications and there is a discussion about using Botox.  She has very fractured sleep at nighttime although no problem initiating sleep.  She takes melatonin which may help her sleep somewhat.  Past Medical History:  Diagnosis Date   Abdominal pain    Anal spasm    Anemia    Asthma    Constipation    Diarrhea    Dysphagia    Environmental and seasonal allergies    Family history of bladder cancer    Family history of breast cancer    Family history of melanoma    Family history of ovarian cancer    GERD (gastroesophageal reflux disease)    Glaucoma    Hemorrhoid    Nausea     Past Surgical History:  Procedure Laterality Date   ABDOMINAL HYSTERECTOMY     pt says hysterectomy was vaginal   APPENDECTOMY     BACK SURGERY     BREAST EXCISIONAL BIOPSY     BREAST LUMPECTOMY     NOSE SURGERY     SHOULDER SURGERY     TONSILLECTOMY     WISDOM TOOTH EXTRACTION      Allergies as of 05/26/2022       Reactions  Doxycycline    Septra [sulfamethoxazole-trimethoprim]         Medication List    albuterol 108 (90 Base) MCG/ACT inhaler Commonly known as: VENTOLIN HFA Inhale 2 puffs into the lungs every 6 (six) hours as needed for wheezing or shortness of breath.   ascorbic acid 500 MG tablet Commonly known as: VITAMIN C Take 500 mg by mouth daily.   budesonide-formoterol 160-4.5 MCG/ACT inhaler Commonly known as: SYMBICORT Inhale 2 puffs into the lungs 2 (two) times daily.   cetirizine 10 MG tablet Commonly known as: ZYRTEC Take 10 mg by mouth daily.   Cholecalciferol 50 MCG (2000 UT) Caps Take 2,000 Units by mouth daily.   Dexcom G7 Sensor Misc 1 each by Does not apply route once a week. Every ten days.   Emgality 120 MG/ML Soaj Generic drug:  Galcanezumab-gnlm Inject 120 mg into the skin every 30 (thirty) days.   famotidine 20 MG tablet Commonly known as: PEPCID Take 20 mg by mouth 2 (two) times daily.   fluticasone 50 MCG/ACT nasal spray Commonly known as: FLONASE Place into both nostrils daily.   gabapentin 300 MG capsule Commonly known as: NEURONTIN TAKE 1 CAPSULE (300 MG TOTAL) BY MOUTH AT BEDTIME.   ibuprofen 200 MG tablet Commonly known as: ADVIL Take 200 mg by mouth as needed.   indomethacin 25 MG capsule Commonly known as: INDOCIN Take 1 capsule (25 mg total) by mouth 3 (three) times daily as needed.   melatonin 3 MG Tabs tablet Take by mouth.   montelukast 10 MG tablet Commonly known as: SINGULAIR Take 10 mg by mouth at bedtime.   Nurtec 75 MG Tbdp Generic drug: Rimegepant Sulfate TAKE 1 TABLET (75 MG) BY MOUTH DAILY AS NEEDED FOR MIGRAINES. TAKE AS CLOSE TO ONSET OF MIGRAINE AS POSSIBLE. ONE DAILY MAXIMUM.   ondansetron 4 MG disintegrating tablet Commonly known as: ZOFRAN-ODT DISSOLVE 1 TO 2 TABLETS ON THE TONGUE EVERY 8 HOURS AS NEEDED FOR NAUSEA. MAY ALSO TAKE WITH MAXALT FOR MIGRAINE   pantoprazole 40 MG tablet Commonly known as: PROTONIX pantoprazole 40 mg tablet,delayed release  TAKE 1 TABLET BY MOUTH EVERY DAY   Qulipta 60 MG Tabs Generic drug: Atogepant Take 60 mg by mouth daily.   raloxifene 60 MG tablet Commonly known as: EVISTA raloxifene 60 mg tablet  TAKE 1 TABLET BY MOUTH ONCE DAILY   rizatriptan 10 MG disintegrating tablet Commonly known as: MAXALT-MLT Take 1 tablet (10 mg total) by mouth as needed for migraine. May repeat in 2 hours if needed    Review of systems negative except as noted in HPI / PMHx or noted below:  Review of Systems  Constitutional: Negative.   HENT: Negative.    Eyes: Negative.   Respiratory: Negative.    Cardiovascular: Negative.   Gastrointestinal: Negative.   Genitourinary: Negative.   Musculoskeletal: Negative.   Skin: Negative.    Neurological: Negative.   Endo/Heme/Allergies: Negative.   Psychiatric/Behavioral: Negative.      Family History  Problem Relation Age of Onset   Breast cancer Sister    Ovarian cancer Sister    Migraines Sister    Migraines Sister    Breast cancer Maternal Aunt    Breast cancer Cousin    Diabetes Other    Hyperlipidemia Other    Stroke Other    Cancer Other    Asthma Other    Colon cancer Neg Hx    Esophageal cancer Neg Hx    Rectal cancer Neg Hx  Stomach cancer Neg Hx     Social History   Socioeconomic History   Marital status: Married    Spouse name: Not on file   Number of children: Not on file   Years of education: Not on file   Highest education level: Not on file  Occupational History   Not on file  Tobacco Use   Smoking status: Never   Smokeless tobacco: Never  Substance and Sexual Activity   Alcohol use: Yes    Comment: 3 glasses of wine a week.   Drug use: No   Sexual activity: Not on file  Other Topics Concern   Not on file  Social History Narrative   Coffe decaf one cup daily.  Education :  Tax adviser x 2,  Dealer- Work.  International aid/development worker).  K[employer] 4.     Environmental and Social history  Lives in a house with a dry environment, dogs and cats located inside the household, carpet in the bedroom, no plastic on the bed, no plastic on the pillow, no smoking ongoing with inside the household.  She is a Producer, television/film/video.  Objective:   Vitals:   05/26/22 1439  BP: 128/68  Pulse: 84  Resp: 18  Temp: 98.2 F (36.8 C)  SpO2: 96%   Height: 5' 5.75" (167 cm) Weight: 182 lb 3.2 oz (82.6 kg)  Physical Exam Constitutional:      Appearance: She is not diaphoretic.  HENT:     Head: Normocephalic.     Right Ear: Tympanic membrane, ear canal and external ear normal.     Left Ear: Tympanic membrane, ear canal and external ear normal.     Nose: Nose normal. No mucosal edema or rhinorrhea.     Mouth/Throat:     Pharynx: Uvula  midline. No oropharyngeal exudate.  Eyes:     Conjunctiva/sclera: Conjunctivae normal.  Neck:     Thyroid: No thyromegaly.     Trachea: Trachea normal. No tracheal tenderness or tracheal deviation.  Cardiovascular:     Rate and Rhythm: Normal rate and regular rhythm.     Heart sounds: Normal heart sounds, S1 normal and S2 normal. No murmur heard. Pulmonary:     Effort: No respiratory distress.     Breath sounds: Normal breath sounds. No stridor. No wheezing or rales.  Lymphadenopathy:     Head:     Right side of head: No tonsillar adenopathy.     Left side of head: No tonsillar adenopathy.     Cervical: No cervical adenopathy.  Skin:    Findings: No erythema or rash.     Nails: There is no clubbing.  Neurological:     Mental Status: She is alert.     Diagnostics: Allergy skin tests were not performed.   Spirometry was performed and demonstrated an FEV1 of 2.56 @ 95 % of predicted. FEV1/FVC = 0.83  Assessment and Plan:    1. Perennial allergic rhinitis   2. Seasonal allergic rhinitis due to pollen   3. LPRD (laryngopharyngeal reflux disease)   4. Migraine syndrome   5. Dysfunction of sleep stage or arousal   6. Asthma, mild intermittent, well-controlled    1. Treat and prevent inflammation of airway:   A. Ryaltris - 2 inhalations 1-2 times per day  B. Asmanex 220 -twisthaler - 1 inhalation 1 time per day  2. Treat and prevent reflux induced respiratory inflammation:   A. Minimize caffeine consumption  B. Replace throat clearing with swallowing/drinking maneuver  C. Increase pantoprazole 40 mg - 1 tablet 2 times per day  D. Famotidine 40 mg - 1 tablet 1 time per day   3. Treat and prevent headache / sleep dysfunction:   A. Minimize caffeine consumption  B. Periactin 4 mg - 1/2-1 tablet at bedtime  C. Consider using Botox  4. If needed:   A. AirSupra - 2 inhalations every 4-6 hours  B. Antihistamine   5. Montelukast ???  6. Return to clinic in 4 weeks or  earlier if problem  Meredith Blackwell appears to have some respiratory tract inflammation that is not under good control and she will use a combination of Ryaltris and Asmanex to address these issues.  There is also a component of LPR that is not under control and we will have her utilize the plan noted above.  She has chronic headache and sleep dysfunction and we will start her on Periactin and she can consider using Botox administered by her neurologist.  I do not think montelukast is giving her any benefit and she can stop this agent.  I will see her back in this clinic in 4 weeks.  Jessica Priest, MD Allergy / Immunology [facility] [place]

## 2022-05-27 ENCOUNTER — Other Ambulatory Visit (HOSPITAL_COMMUNITY): Payer: Self-pay

## 2022-05-27 ENCOUNTER — Telehealth: Payer: Self-pay

## 2022-05-27 MED ORDER — PULMICORT FLEXHALER 180 MCG/ACT IN AEPB: INHALATION_SPRAY | RESPIRATORY_TRACT | 4 refills | Status: DC

## 2022-05-27 NOTE — Telephone Encounter (Signed)
PA request received via CMM for Asmanex (30 Metered Doses) 220MCG/ACT aerosol powder  PA not submitted due to no alternatives noted as failed.   Preferred is Pulmicort Flexhaler  Key: [id number]

## 2022-05-27 NOTE — Telephone Encounter (Signed)
Forwarding message to provider for next step. 

## 2022-05-27 NOTE — Telephone Encounter (Signed)
PA request received via CMM for Ryaltris 665-25MCG/ACT suspension  PA submitted to [employer] and is pending determination  Key: [id number]

## 2022-05-27 NOTE — Telephone Encounter (Signed)
Per provider:  Pulmicort 180 - 2 inhalations 1-2 times per day   Called patient - DOB/Pharmacy verified  - advised of provider notation above.  Patient verbalized understanding, no further questions.

## 2022-05-28 MED ORDER — RYALTRIS 665-25 MCG/ACT NA SUSP: 2.0000 | Freq: Two times a day (BID) | NASAL | 5 refills | Status: DC

## 2022-05-28 NOTE — Addendum Note (Signed)
Addended by: Areta Haber B on: 05/28/2022 04:14 PM   Modules accepted: Orders

## 2022-05-28 NOTE — Telephone Encounter (Signed)
Per provider:  Please use the co pay plan which will pay for this medication at $49 / month, which will translate to around $25 month if used 1 time a day instead of 2 times per day, which can be used if insurance denies script.   Resending Ryaltris prescription with co pay plan noted above.

## 2022-05-28 NOTE — Telephone Encounter (Signed)
Forwarding below notation to provider for next step. 

## 2022-05-28 NOTE — Telephone Encounter (Signed)
Your plan only covers this drug when you meet one of these options: A) You have tried other drugs your plan covers (preferred drugs), and they did not work well for you, or B) Your doctor gives [place] a medical reason you cannot take those other drugs. For your plan, you may need to try up to three preferred drugs. We have denied your request because you do not meet any of these conditions. We reviewed the information we had. Your request has been denied. Your doctor can send [place] any new or missing information for [place] to review. The preferred drugs for your plan are: azelastine-fluticasone, flunisolide, fluticasone, mometasone (Requirement: 3 in a class with 3 or more alternatives, 2 in a class with 2 alternatives, or 1 in a class with only 1 alternative.). Your doctor may need to get approval from your plan for preferred drugs. For this drug, you may have to meet other criteria. You can request the drug policy for more details. You can also request other plan documents for your review.

## 2022-06-01 ENCOUNTER — Encounter: Payer: Self-pay | Admitting: Allergy and Immunology

## 2022-06-05 ENCOUNTER — Other Ambulatory Visit (HOSPITAL_COMMUNITY): Payer: Self-pay

## 2022-06-24 ENCOUNTER — Other Ambulatory Visit: Payer: Self-pay | Admitting: Neurology

## 2022-06-24 DIAGNOSIS — N951 Menopausal and female climacteric states: Secondary | ICD-10-CM

## 2022-06-24 DIAGNOSIS — M5416 Radiculopathy, lumbar region: Secondary | ICD-10-CM

## 2022-07-01 DIAGNOSIS — Z01419 Encounter for gynecological examination (general) (routine) without abnormal findings: Secondary | ICD-10-CM | POA: Diagnosis not present

## 2022-07-01 DIAGNOSIS — Z6829 Body mass index (BMI) 29.0-29.9, adult: Secondary | ICD-10-CM | POA: Diagnosis not present

## 2022-07-02 ENCOUNTER — Other Ambulatory Visit: Payer: Self-pay

## 2022-07-02 ENCOUNTER — Other Ambulatory Visit (HOSPITAL_COMMUNITY): Payer: Self-pay

## 2022-07-02 ENCOUNTER — Ambulatory Visit (INDEPENDENT_AMBULATORY_CARE_PROVIDER_SITE_OTHER): Payer: BC Managed Care – PPO | Admitting: Family Medicine

## 2022-07-02 ENCOUNTER — Encounter: Payer: Self-pay | Admitting: Family Medicine

## 2022-07-02 VITALS — BP 110/70 | HR 60 | Temp 97.7°F | Resp 16 | Wt 182.7 lb

## 2022-07-02 DIAGNOSIS — G43909 Migraine, unspecified, not intractable, without status migrainosus: Secondary | ICD-10-CM

## 2022-07-02 DIAGNOSIS — J4520 Mild intermittent asthma, uncomplicated: Secondary | ICD-10-CM

## 2022-07-02 DIAGNOSIS — J3089 Other allergic rhinitis: Secondary | ICD-10-CM

## 2022-07-02 DIAGNOSIS — K219 Gastro-esophageal reflux disease without esophagitis: Secondary | ICD-10-CM

## 2022-07-02 DIAGNOSIS — G4720 Circadian rhythm sleep disorder, unspecified type: Secondary | ICD-10-CM | POA: Diagnosis not present

## 2022-07-02 DIAGNOSIS — J301 Allergic rhinitis due to pollen: Secondary | ICD-10-CM

## 2022-07-02 MED ORDER — FLUTICASONE-SALMETEROL 100-50 MCG/ACT IN AEPB: 1.0000 | INHALATION_SPRAY | Freq: Two times a day (BID) | RESPIRATORY_TRACT | 5 refills | Status: DC

## 2022-07-02 NOTE — Patient Instructions (Addendum)
  1. Treat and prevent inflammation of airway:   A. Flonase - 2 inhalations 1-2 times per day  B. Wixela - 1 inhalation 2 times per day  2. Treat and prevent reflux induced respiratory inflammation:   A. Minimize caffeine consumption  B. Replace throat clearing with swallowing/drinking maneuver  C. Continue pantoprazole 40 mg - 1 tablet 2 times per day  D. Continue famotidine 40 mg - 1 tablet 1 time per day   3. Treat and prevent headache / sleep dysfunction:   A. Minimize caffeine consumption  B. Periactin 4 mg - 1/2-1 tablet at bedtime  C. Consider using Botox  4. If needed:   A. AirSupra - 2 inhalations every 4-6 hours  B. Antihistamine   5. Return to clinic in 8 weeks or earlier if problem

## 2022-07-02 NOTE — Progress Notes (Signed)
[address] [place] [address] Dept: [phone]  FOLLOW UP NOTE  Patient ID: Meredith Blackwell, female    DOB: [birth year]  Age: 57 y.o. MRN: [id number] Date of Office Visit: 07/02/2022  Assessment  Chief Complaint: Follow-up (Haven't used the nasal spray because of the prior auth. Inhaler wasn't covered by insurance and filled an alternative but that is expensive for the patient as well.)  HPI Meredith Blackwell is a 57 year old female who presents to the clinic for follow-up visit.  She was last seen in this clinic on 05/26/2022 by Dr. Lucie Leather for evaluation of asthma, allergic rhinitis, reflux, migraine syndrome, and sleep dysfunction.  At today's visit, she reports her symptoms remain mostly the same as her last visit as she has had difficulty obtaining the medications due to insurance and cost.  She reports her asthma has been moderately well-controlled with intermittent dry cough as the main symptom.  She denies shortness of breath or wheeze with activity or rest.  She reports that she has previously been well-controlled on Symbicort, however, her insurance company has stopped paying for this medication.  She was prescribed Asmanex at her last visit, however, this was not covered on her insurance and she settled with Pulmicort with a high co-pay.  After at least 30 minutes on the phone with her insurance company during the visit we have settled on Wixela for a $10 co-pay.  Allergic rhinitis is reported as well-controlled with Allegra or cetirizine.  She switches these periodically to maintain excellent efficacy.  She currently denies rhinorrhea, nasal congestion, sneezing, and postnasal drainage.  She is not currently using Flonase or nasal saline rinses.  Reflux is reported as moderately well-controlled with frequent symptoms of heartburn.  She reports this has decreased some since her last visit.  She continues famotidine 40 mg at night and pantoprazole 40 mg twice a day.  She reports that  she is taking Periactin before bedtime on most nights with improved sleep pattern.  Her current medications are listed in the chart.    Drug Allergies:  Allergies  Allergen Reactions   Doxycycline    Septra [Sulfamethoxazole-Trimethoprim]     Physical Exam: BP 110/70   Pulse 60   Temp 97.7 F (36.5 C) (Temporal)   Resp 16   Wt 182 lb 11.2 oz (82.9 kg)   SpO2 96%   BMI 29.71 kg/m    Physical Exam Vitals reviewed.  Constitutional:      Appearance: Normal appearance.  HENT:     Head: Normocephalic and atraumatic.     Right Ear: Tympanic membrane normal.     Left Ear: Tympanic membrane normal.     Nose:     Comments: Bilateral naris slightly erythematous with clear nasal drainage noted.  Pharynx normal.  Ears normal.  Eyes normal.    Mouth/Throat:     Pharynx: Oropharynx is clear.  Eyes:     Conjunctiva/sclera: Conjunctivae normal.  Cardiovascular:     Rate and Rhythm: Normal rate and regular rhythm.     Heart sounds: Normal heart sounds. No murmur heard. Pulmonary:     Effort: Pulmonary effort is normal.     Breath sounds: Normal breath sounds.     Comments: Lungs clear to auscultation Musculoskeletal:        General: Normal range of motion.     Cervical back: Normal range of motion and neck supple.  Skin:    General: Skin is warm and dry.  Neurological:  Mental Status: She is alert and oriented to person, place, and time.  Psychiatric:        Mood and Affect: Mood normal.        Behavior: Behavior normal.        Thought Content: Thought content normal.        Judgment: Judgment normal.     Assessment and Plan: 1. Seasonal allergic rhinitis due to pollen   2. Perennial allergic rhinitis   3. Migraine syndrome   4. Dysfunction of sleep stage or arousal   5. LPRD (laryngopharyngeal reflux disease)   6. Asthma, mild intermittent, well-controlled     Meds ordered this encounter  Medications   fluticasone-salmeterol (WIXELA INHUB) 100-50 MCG/ACT AEPB     Sig: Inhale 1 puff into the lungs 2 (two) times daily.    Dispense:  1 each    Refill:  5    Patient Instructions   1. Treat and prevent inflammation of airway:   A. Flonase - 2 inhalations 1-2 times per day  B. Wixela - 1 inhalation 2 times per day  2. Treat and prevent reflux induced respiratory inflammation:   A. Minimize caffeine consumption  B. Replace throat clearing with swallowing/drinking maneuver  C. Continue pantoprazole 40 mg - 1 tablet 2 times per day  D. Continue famotidine 40 mg - 1 tablet 1 time per day   3. Treat and prevent headache / sleep dysfunction:   A. Minimize caffeine consumption  B. Periactin 4 mg - 1/2-1 tablet at bedtime  C. Consider using Botox  4. If needed:   A. AirSupra - 2 inhalations every 4-6 hours  B. Antihistamine   5. Return to clinic in 8 weeks or earlier if problem  Return in about 2 months (around 09/01/2022), or if symptoms worsen or fail to improve.    Thank you for the opportunity to care for this patient.  Please do not hesitate to contact me with questions.  Thermon Leyland, FNP [facility]

## 2022-08-18 DIAGNOSIS — L821 Other seborrheic keratosis: Secondary | ICD-10-CM | POA: Diagnosis not present

## 2022-08-18 DIAGNOSIS — D2261 Melanocytic nevi of right upper limb, including shoulder: Secondary | ICD-10-CM | POA: Diagnosis not present

## 2022-08-18 DIAGNOSIS — D485 Neoplasm of uncertain behavior of skin: Secondary | ICD-10-CM | POA: Diagnosis not present

## 2022-08-18 DIAGNOSIS — D225 Melanocytic nevi of trunk: Secondary | ICD-10-CM | POA: Diagnosis not present

## 2022-08-24 ENCOUNTER — Other Ambulatory Visit: Payer: Self-pay

## 2022-08-24 ENCOUNTER — Encounter: Payer: Self-pay | Admitting: Family Medicine

## 2022-08-24 ENCOUNTER — Ambulatory Visit: Payer: BC Managed Care – PPO | Admitting: Family Medicine

## 2022-08-24 VITALS — BP 118/60 | HR 82 | Temp 98.0°F | Resp 20 | Wt 181.2 lb

## 2022-08-24 DIAGNOSIS — J3089 Other allergic rhinitis: Secondary | ICD-10-CM

## 2022-08-24 DIAGNOSIS — J4540 Moderate persistent asthma, uncomplicated: Secondary | ICD-10-CM | POA: Diagnosis not present

## 2022-08-24 DIAGNOSIS — G4720 Circadian rhythm sleep disorder, unspecified type: Secondary | ICD-10-CM | POA: Diagnosis not present

## 2022-08-24 DIAGNOSIS — K219 Gastro-esophageal reflux disease without esophagitis: Secondary | ICD-10-CM | POA: Diagnosis not present

## 2022-08-24 DIAGNOSIS — J302 Other seasonal allergic rhinitis: Secondary | ICD-10-CM

## 2022-08-24 NOTE — Progress Notes (Unsigned)
[address] [place] [address] Dept: [phone]  FOLLOW UP NOTE  Patient ID: Meredith Blackwell, female    DOB: [birth year]  Age: 57 y.o. MRN: [id number] Date of Office Visit: 08/24/2022  Assessment  Chief Complaint: Follow-up (cough)  HPI Meredith Blackwell is a 57 year old female who presents to clinic for follow-up visit.  She was last seen in this clinic on 07/02/2018 for by Thermon Leyland, FNP, for evaluation of asthma, allergic rhinitis, reflux, migraines, and sleep dysfunction.  At today's visit, she reports that her asthma has been moderately well-controlled over the last 2-1/2 weeks with symptoms including dry cough occurring in the daytime and nighttime with occasional shortness of breath while coughing.  She reports that she went to the beach a few weeks ago and did not take her maintenance inhaler, Wixela, with her.  She does report that prior to her beach trip she was starting to experience some dry cough which worsened without the use of her daily asthma inhaler.  She denies wheezing or shortness of breath not associated with the cough.  With the exception of her time at the beach, she continues Wixela 100-1 puff twice a day and rarely needs to use Airsupra.  She did use Airsupra in addition to [place] about every other day over the last week with some improvement in symptoms.   Allergic rhinitis is reported as moderately well-controlled with occasional clear rhinorrhea, nasal congestion occurring occasionally in the evening, and occasional postnasal drainage.  She did visit her ENT specialist for globus sensation with no new orders.  She continues Flonase daily and Allegra daily.  She is not currently using a nasal saline rinse.  Her last environmental allergy skin testing was on 11/02/2000 and was positive to cat, dust mite and ragweed.   Reflux is reported as moderately well-controlled with heartburn as the main symptom.  She continues pantoprazole 40 mg twice a day and famotidine 40 mg once  a day with moderate relief of symptoms.  She continues to follow-up with her GI specialist as recommended  She reports that she is sleeping well and is rarely taking periactin.  Her current medications are listed in the chart.  Drug Allergies:  Allergies  Allergen Reactions   Doxycycline    Septra [Sulfamethoxazole-Trimethoprim]     Physical Exam: BP 118/60   Pulse 82   Temp 98 F (36.7 C) (Temporal)   Resp 20   Wt 181 lb 3.2 oz (82.2 kg)   SpO2 95%   BMI 29.47 kg/m    Physical Exam Vitals reviewed.  Constitutional:      Appearance: Normal appearance.  HENT:     Head: Normocephalic and atraumatic.     Right Ear: Tympanic membrane normal.     Left Ear: Tympanic membrane normal.     Nose:     Comments: Bilateral nares slightly erythematous with thin clear nasal drainage noted.  Pharynx slightly erythematous with no exudate.  Ears normal.  Eyes normal. Eyes:     Conjunctiva/sclera: Conjunctivae normal.  Cardiovascular:     Rate and Rhythm: Normal rate and regular rhythm.     Heart sounds: Normal heart sounds. No murmur heard. Pulmonary:     Effort: Pulmonary effort is normal.     Breath sounds: Normal breath sounds.     Comments: Lungs clear to auscultation Musculoskeletal:        General: Normal range of motion.     Cervical back: Normal range of motion and neck supple.  Skin:    General: Skin is warm and dry.  Neurological:     Mental Status: She is alert and oriented to person, place, and time.  Psychiatric:        Mood and Affect: Mood normal.        Behavior: Behavior normal.        Thought Content: Thought content normal.        Judgment: Judgment normal.       Assessment and Plan: 1. Moderate persistent asthma without complication   2. Seasonal and perennial allergic rhinitis   3. LPRD (laryngopharyngeal reflux disease)   4. Dysfunction of sleep stage or arousal     Meds ordered this encounter  Medications   Albuterol-Budesonide (AIRSUPRA) 90-80  MCG/ACT AERO    Sig: Inhale 2 Inhalations into the lungs every 4 (four) hours as needed.    Dispense:  11 g    Refill:  1    [id number]   pantoprazole (PROTONIX) 40 MG tablet    Sig: Take 1 tablet (40 mg total) by mouth 2 (two) times daily.    Dispense:  180 tablet    Refill:  1   famotidine (PEPCID) 40 MG tablet    Sig: Take 1 tablet (40 mg total) by mouth in the morning.    Dispense:  90 tablet    Refill:  1   cyproheptadine (PERIACTIN) 4 MG tablet    Sig: Take 0.5 tablets (2 mg total) by mouth at bedtime.    Dispense:  30 tablet    Refill:  5    Patient Instructions   1. Treat and prevent inflammation of airway:   A. Flonase - 2 inhalations 1-2 times per day  B. Wixela - 1 inhalation 2 times per day  C. Use AirSupra 2 puffs twice a day for 1-2 weeks, then as needed  2. Treat and prevent reflux induced respiratory inflammation:   A. Minimize caffeine consumption  B. Replace throat clearing with swallowing/drinking maneuver  C. Continue pantoprazole 40 mg - 1 tablet 2 times per day  D. Continue famotidine 40 mg - 1 tablet 1 time per day   3. Treat and prevent headache / sleep dysfunction:   A. Minimize caffeine consumption  B. Periactin 4 mg - 1/2-1 tablet at bedtime  4. If needed:   A. AirSupra - 2 inhalations every 4-6 hours  B. Allegra  5. Return to clinic in 4 months or earlier if problem  Return in about 4 months (around 12/24/2022), or if symptoms worsen or fail to improve.    Thank you for the opportunity to care for this patient.  Please do not hesitate to contact me with questions.  Thermon Leyland, FNP [facility]

## 2022-08-24 NOTE — Patient Instructions (Addendum)
  1. Treat and prevent inflammation of airway:   A. Flonase - 2 inhalations 1-2 times per day  B. Wixela - 1 inhalation 2 times per day  C. Use AirSupra 2 puffs twice a day for 1-2 weeks, then as needed  2. Treat and prevent reflux induced respiratory inflammation:   A. Minimize caffeine consumption  B. Replace throat clearing with swallowing/drinking maneuver  C. Continue pantoprazole 40 mg - 1 tablet 2 times per day  D. Continue famotidine 40 mg - 1 tablet 1 time per day   3. Treat and prevent headache / sleep dysfunction:   A. Minimize caffeine consumption  B. Periactin 4 mg - 1/2-1 tablet at bedtime  4. If needed:   A. AirSupra - 2 inhalations every 4-6 hours  B. Allegra  5. Return to clinic in 4 months or earlier if problem

## 2022-08-25 ENCOUNTER — Encounter: Payer: Self-pay | Admitting: Family Medicine

## 2022-08-25 DIAGNOSIS — J302 Other seasonal allergic rhinitis: Secondary | ICD-10-CM | POA: Insufficient documentation

## 2022-08-25 DIAGNOSIS — G4720 Circadian rhythm sleep disorder, unspecified type: Secondary | ICD-10-CM | POA: Insufficient documentation

## 2022-08-25 DIAGNOSIS — J4540 Moderate persistent asthma, uncomplicated: Secondary | ICD-10-CM | POA: Insufficient documentation

## 2022-08-25 DIAGNOSIS — K219 Gastro-esophageal reflux disease without esophagitis: Secondary | ICD-10-CM | POA: Insufficient documentation

## 2022-08-25 MED ORDER — FAMOTIDINE 40 MG PO TABS: 40.0000 mg | ORAL_TABLET | Freq: Every morning | ORAL | 1 refills | Status: DC

## 2022-08-25 MED ORDER — CYPROHEPTADINE HCL 4 MG PO TABS: 2.0000 mg | ORAL_TABLET | Freq: Every evening | ORAL | 5 refills | Status: DC

## 2022-08-25 MED ORDER — AIRSUPRA 90-80 MCG/ACT IN AERO: 2.0000 | INHALATION_SPRAY | RESPIRATORY_TRACT | 1 refills | Status: DC | PRN

## 2022-08-25 MED ORDER — PANTOPRAZOLE SODIUM 40 MG PO TBEC: 40.0000 mg | DELAYED_RELEASE_TABLET | Freq: Two times a day (BID) | ORAL | 1 refills | Status: DC

## 2022-08-26 DIAGNOSIS — S61212A Laceration without foreign body of right middle finger without damage to nail, initial encounter: Secondary | ICD-10-CM | POA: Diagnosis not present

## 2022-08-26 DIAGNOSIS — S61202A Unspecified open wound of right middle finger without damage to nail, initial encounter: Secondary | ICD-10-CM | POA: Diagnosis not present

## 2022-08-31 ENCOUNTER — Encounter: Payer: Self-pay | Admitting: Internal Medicine

## 2022-09-03 ENCOUNTER — Encounter: Payer: Self-pay | Admitting: Internal Medicine

## 2022-09-03 ENCOUNTER — Other Ambulatory Visit: Payer: Self-pay | Admitting: Internal Medicine

## 2022-09-03 ENCOUNTER — Ambulatory Visit
Admission: RE | Admit: 2022-09-03 | Discharge: 2022-09-03 | Disposition: A | Discharge status: Home / Self Care | Payer: BC Managed Care – PPO | Source: Ambulatory Visit | Attending: Internal Medicine | Admitting: Internal Medicine

## 2022-09-03 DIAGNOSIS — N6019 Diffuse cystic mastopathy of unspecified breast: Secondary | ICD-10-CM

## 2022-09-03 DIAGNOSIS — Z1239 Encounter for other screening for malignant neoplasm of breast: Secondary | ICD-10-CM | POA: Diagnosis not present

## 2022-09-03 DIAGNOSIS — R928 Other abnormal and inconclusive findings on diagnostic imaging of breast: Secondary | ICD-10-CM

## 2022-09-03 MED ORDER — GADOPICLENOL 0.5 MMOL/ML IV SOLN
8.0000 mL | Freq: Once | INTRAVENOUS | Status: AC | PRN
  Administered 2022-09-03: 8 mL via INTRAVENOUS

## 2022-09-07 ENCOUNTER — Ambulatory Visit: Payer: BC Managed Care – PPO

## 2022-09-07 ENCOUNTER — Other Ambulatory Visit: Payer: Self-pay | Admitting: Internal Medicine

## 2022-09-07 ENCOUNTER — Ambulatory Visit
Admission: RE | Admit: 2022-09-07 | Discharge: 2022-09-07 | Disposition: A | Discharge status: Home / Self Care | Payer: BC Managed Care – PPO | Source: Ambulatory Visit | Attending: Internal Medicine | Admitting: Internal Medicine

## 2022-09-07 DIAGNOSIS — R928 Other abnormal and inconclusive findings on diagnostic imaging of breast: Secondary | ICD-10-CM

## 2022-09-07 DIAGNOSIS — N6320 Unspecified lump in the left breast, unspecified quadrant: Secondary | ICD-10-CM | POA: Diagnosis not present

## 2022-09-07 MED ORDER — GADOPICLENOL 0.5 MMOL/ML IV SOLN
7.5000 mL | Freq: Once | INTRAVENOUS | Status: AC | PRN
  Administered 2022-09-07: 7.5 mL via INTRAVENOUS

## 2022-09-16 DIAGNOSIS — E559 Vitamin D deficiency, unspecified: Secondary | ICD-10-CM | POA: Diagnosis not present

## 2022-09-16 DIAGNOSIS — D509 Iron deficiency anemia, unspecified: Secondary | ICD-10-CM | POA: Diagnosis not present

## 2022-09-16 DIAGNOSIS — E785 Hyperlipidemia, unspecified: Secondary | ICD-10-CM | POA: Diagnosis not present

## 2022-09-16 DIAGNOSIS — R7301 Impaired fasting glucose: Secondary | ICD-10-CM | POA: Diagnosis not present

## 2022-09-24 DIAGNOSIS — N907 Vulvar cyst: Secondary | ICD-10-CM | POA: Diagnosis not present

## 2022-09-24 DIAGNOSIS — D280 Benign neoplasm of vulva: Secondary | ICD-10-CM | POA: Diagnosis not present

## 2022-10-12 ENCOUNTER — Other Ambulatory Visit: Payer: Self-pay | Admitting: *Deleted

## 2022-10-12 ENCOUNTER — Encounter: Payer: Self-pay | Admitting: Family Medicine

## 2022-10-12 MED ORDER — QULIPTA 60 MG PO TABS: 60.0000 mg | ORAL_TABLET | Freq: Every day | ORAL | 1 refills | Status: DC

## 2022-10-22 DIAGNOSIS — N61 Mastitis without abscess: Secondary | ICD-10-CM | POA: Diagnosis not present

## 2022-10-22 DIAGNOSIS — N644 Mastodynia: Secondary | ICD-10-CM | POA: Diagnosis not present

## 2022-11-19 ENCOUNTER — Encounter: Payer: Self-pay | Admitting: Allergy and Immunology

## 2022-11-23 DIAGNOSIS — R058 Other specified cough: Secondary | ICD-10-CM | POA: Diagnosis not present

## 2022-11-23 DIAGNOSIS — J189 Pneumonia, unspecified organism: Secondary | ICD-10-CM | POA: Diagnosis not present

## 2022-11-25 ENCOUNTER — Encounter: Payer: Self-pay | Admitting: Allergy and Immunology

## 2022-11-27 ENCOUNTER — Other Ambulatory Visit (HOSPITAL_COMMUNITY): Payer: Self-pay

## 2022-11-27 ENCOUNTER — Telehealth: Payer: Self-pay

## 2022-11-27 NOTE — Telephone Encounter (Signed)
Pharmacy Patient Advocate Encounter   Received notification from CoverMyMeds that prior authorization for Fluticasone-Salmeterol 100-50MCG/ACT aerosol powder is required/requested.   Insurance verification completed.   The patient is insured through [facility] .   Per test claim: The current 30 day co-pay is, $0.00. Must be run as [employer] and not generic.  No PA needed at this time. This test claim was processed through [facility]- copay amounts may vary at other pharmacies due to pharmacy/plan contracts, or as the patient moves through the different stages of their insurance plan.

## 2022-11-28 ENCOUNTER — Other Ambulatory Visit: Payer: Self-pay | Admitting: Neurology

## 2022-11-28 DIAGNOSIS — G43711 Chronic migraine without aura, intractable, with status migrainosus: Secondary | ICD-10-CM

## 2022-11-30 ENCOUNTER — Other Ambulatory Visit: Payer: Self-pay

## 2022-11-30 DIAGNOSIS — G43009 Migraine without aura, not intractable, without status migrainosus: Secondary | ICD-10-CM

## 2022-11-30 MED ORDER — NURTEC 75 MG PO TBDP
ORAL_TABLET | ORAL | 0 refills | Status: DC
Start: 2022-11-30 — End: 2023-03-18

## 2022-11-30 NOTE — Telephone Encounter (Signed)
Rx refilled per last office visit note, "She uses either Nurtec or rizatriptan. May combine the two with intractable headaches."

## 2022-12-09 DIAGNOSIS — H43391 Other vitreous opacities, right eye: Secondary | ICD-10-CM | POA: Diagnosis not present

## 2022-12-16 DIAGNOSIS — J189 Pneumonia, unspecified organism: Secondary | ICD-10-CM | POA: Diagnosis not present

## 2022-12-16 DIAGNOSIS — J45909 Unspecified asthma, uncomplicated: Secondary | ICD-10-CM | POA: Diagnosis not present

## 2022-12-24 ENCOUNTER — Ambulatory Visit: Payer: BC Managed Care – PPO | Admitting: Family Medicine

## 2022-12-24 NOTE — Patient Instructions (Addendum)
  1. Treat and prevent inflammation of airway:   A. Flonase - 2 inhalations 1-2 times per day  B. Wixela - 1 inhalation 2 times per day  C. Use AirSupra 2 puffs twice a day for 1-2 weeks, then as needed  2. Treat and prevent reflux induced respiratory inflammation:   A. Minimize caffeine consumption  B. Replace throat clearing with swallowing/drinking maneuver  C. Continue pantoprazole 40 mg - 1 tablet 2 times per day  D. Continue famotidine 40 mg - 1 tablet 1 time per day   E. Refer to GI specialist for evaluation of reflux  3. Treat and prevent headache / sleep dysfunction:   A. Minimize caffeine consumption  B. Periactin 4 mg - 1/2-1 tablet at bedtime  4. If needed:   A. AirSupra - 2 inhalations every 4-6 hours  B. Allegra  5. Recurrent infections A. lab evaluation. Will call with results B. Follow up chest xray on 01/01/2023  6. Follow up in 3 months or sooner if needed

## 2022-12-24 NOTE — Progress Notes (Signed)
[address] [place] [address] Dept: [phone]  FOLLOW UP NOTE  Patient ID: Meredith Blackwell, female    DOB: 08/31/1965  Age: 57 y.o. MRN: [id number] Date of Office Visit: 12/25/2022  Assessment  Chief Complaint: Follow-up  HPI Meredith Blackwell is a 57 year old female who presents to the clinic for follow-up visit.  She was last seen in this clinic on 08/24/2022 by Thermon Leyland, FNP for evaluation of asthma, allergic rhinitis, reflux, and disordered sleep.  In the interim, she a left sided pneumonia November 23, 2022 requiring azithromycin, Augmentin, and prednisone.  A follow-up chest x-ray 9 days ago indicated the pneumonia was resolving slowly and she was prescribed Levaquin and received a steroid injection.  She is currently on the last day of Levaquin.  At today's visit, she reports her asthma has been moderately well-controlled with symptoms including shortness of breath with activity and cough which is not producing any mucus.  She reports this is a recent change as she was experiencing cough producing thick clear mucus.  She continues Pulmicort 2 puffs once a day and infrequently uses Airsupra for relief of asthma symptoms.  Allergic rhinitis is reported as moderately well-controlled with occasional nasal symptoms including clear rhinorrhea and nasal congestion.  She continues Equities trader daily.  Chart review indicates there is no environmental allergy testing available for review.    She reports the need for antibiotics for sinus infections 1-2 times a year in addition to cellulitis in the right breast 3-4 times over the last several years.  She has recently experienced pneumonia 2 times.  She has not had previous immune screening.  Reflux is reported as poorly controlled with heartburn occurring almost every day.  She denies vomiting.  She continues pantoprazole 40 mg twice a day and famotidine 40 mg twice a day.  She does not currently see a gastroenterologist for control of  reflux.  She reports that she continues to experience issues with sleep for which she occasionally takes Periactin.  She reports that when she takes Periactin she sleeps well, however, feels groggy in the morning.  Her current medications are listed in the chart.  Drug Allergies:  Allergies  Allergen Reactions   Doxycycline    Septra [Sulfamethoxazole-Trimethoprim]     Physical Exam: BP 110/70   Pulse 84   Temp 98 F (36.7 C) (Temporal)   Resp 16   SpO2 98%    Physical Exam Vitals reviewed.  Constitutional:      Appearance: Normal appearance.  HENT:     Head: Normocephalic and atraumatic.     Right Ear: Tympanic membrane normal.     Left Ear: Tympanic membrane normal.     Nose:     Comments: Bilateral nares slightly erythematous with thin clear nasal drainage noted.  Pharynx normal.  Ears normal.  Eyes normal.    Mouth/Throat:     Pharynx: Oropharynx is clear.  Eyes:     Conjunctiva/sclera: Conjunctivae normal.  Cardiovascular:     Rate and Rhythm: Normal rate and regular rhythm.     Heart sounds: Normal heart sounds. No murmur heard. Pulmonary:     Effort: Pulmonary effort is normal.     Breath sounds: Normal breath sounds.     Comments: Lungs clear to auscultation Musculoskeletal:        General: Normal range of motion.     Cervical back: Normal range of motion and neck supple.  Skin:    General: Skin is warm  and dry.  Neurological:     Mental Status: She is alert and oriented to person, place, and time.  Psychiatric:        Mood and Affect: Mood normal.        Behavior: Behavior normal.        Thought Content: Thought content normal.        Judgment: Judgment normal.     Diagnostics: FVC 2.99 which is 88% of predicted value, FEV1 2.53 which is 94% of predicted value.  Spirometry indicates normal ventilatory function.  Will pretty sure  Assessment and Plan: 1. Recurrent infections   2. Mild persistent asthma without complication   3. Pneumonia of left  lung due to infectious organism, unspecified part of lung     Patient Instructions   1. Treat and prevent inflammation of airway:   A. Flonase - 2 inhalations 1-2 times per day  B. Wixela - 1 inhalation 2 times per day  C. Use AirSupra 2 puffs twice a day for 1-2 weeks, then as needed  2. Treat and prevent reflux induced respiratory inflammation:   A. Minimize caffeine consumption  B. Replace throat clearing with swallowing/drinking maneuver  C. Continue pantoprazole 40 mg - 1 tablet 2 times per day  D. Continue famotidine 40 mg - 1 tablet 1 time per day   E. Refer to GI specialist for evaluation of reflux  3. Treat and prevent headache / sleep dysfunction:   A. Minimize caffeine consumption  B. Periactin 4 mg - 1/2-1 tablet at bedtime  4. If needed:   A. AirSupra - 2 inhalations every 4-6 hours  B. Allegra  5. Recurrent infections A. lab evaluation. Will call with results B. Follow up chest xray on 01/01/2023  6. Follow up in 3 months or sooner if needed   Return in about 3 months (around 03/25/2023), or if symptoms worsen or fail to improve.    Thank you for the opportunity to care for this patient.  Please do not hesitate to contact me with questions.  Thermon Leyland, FNP [facility]

## 2022-12-25 ENCOUNTER — Ambulatory Visit (INDEPENDENT_AMBULATORY_CARE_PROVIDER_SITE_OTHER): Payer: BC Managed Care – PPO | Admitting: Family Medicine

## 2022-12-25 ENCOUNTER — Other Ambulatory Visit: Payer: Self-pay

## 2022-12-25 ENCOUNTER — Encounter: Payer: Self-pay | Admitting: Family Medicine

## 2022-12-25 VITALS — BP 110/70 | HR 84 | Temp 98.0°F | Resp 16

## 2022-12-25 DIAGNOSIS — B999 Unspecified infectious disease: Secondary | ICD-10-CM

## 2022-12-25 DIAGNOSIS — J4530 Mild persistent asthma, uncomplicated: Secondary | ICD-10-CM

## 2022-12-25 DIAGNOSIS — J189 Pneumonia, unspecified organism: Secondary | ICD-10-CM | POA: Diagnosis not present

## 2022-12-28 ENCOUNTER — Telehealth: Payer: Self-pay

## 2022-12-28 ENCOUNTER — Other Ambulatory Visit (HOSPITAL_COMMUNITY): Payer: Self-pay

## 2022-12-28 NOTE — Telephone Encounter (Signed)
Pharmacy Patient Advocate Encounter   Received notification from CoverMyMeds that prior authorization for Qulipta 60MG  tablets is required/requested.   Insurance verification completed.   The patient is insured through [facility] .   Per test claim: PA required; PA submitted to above mentioned insurance via CoverMyMeds Key/confirmation #/EOC [facility] Status is pending

## 2022-12-29 ENCOUNTER — Other Ambulatory Visit (HOSPITAL_COMMUNITY): Payer: Self-pay

## 2022-12-29 NOTE — Telephone Encounter (Signed)
Pharmacy Patient Advocate Encounter  Received notification from [facility] that Prior Authorization for Qulipta 60MG  tablets has been APPROVED from 12/29/2022 to 12/29/2023. Unable to obtain price due to refill too soon rejection, last fill date 11/13/2022 next available fill date1/15/2024   PA #/Case ID/Reference #: PA Case ID #: [id number]

## 2023-01-01 ENCOUNTER — Ambulatory Visit
Admission: RE | Admit: 2023-01-01 | Discharge: 2023-01-01 | Disposition: A | Discharge status: Home / Self Care | Payer: BC Managed Care – PPO | Source: Ambulatory Visit | Attending: Family Medicine | Admitting: Family Medicine

## 2023-01-01 DIAGNOSIS — J189 Pneumonia, unspecified organism: Secondary | ICD-10-CM | POA: Diagnosis not present

## 2023-01-01 LAB — CBC WITH DIFFERENTIAL/PLATELET
Basophils Absolute: 0.1 10*3/uL (ref 0.0–0.2)
Basos: 1 %
EOS (ABSOLUTE): 0.1 10*3/uL (ref 0.0–0.4)
Eos: 1 %
Hematocrit: 42.3 % (ref 34.0–46.6)
Hemoglobin: 14 g/dL (ref 11.1–15.9)
Immature Grans (Abs): 0.1 10*3/uL (ref 0.0–0.1)
Immature Granulocytes: 1 %
Lymphocytes Absolute: 2.1 10*3/uL (ref 0.7–3.1)
Lymphs: 25 %
MCH: 29.7 pg (ref 26.6–33.0)
MCHC: 33.1 g/dL (ref 31.5–35.7)
MCV: 90 fL (ref 79–97)
Monocytes Absolute: 0.6 10*3/uL (ref 0.1–0.9)
Monocytes: 7 %
Neutrophils Absolute: 5.7 10*3/uL (ref 1.4–7.0)
Neutrophils: 65 %
Platelets: 328 10*3/uL (ref 150–450)
RBC: 4.71 x10E6/uL (ref 3.77–5.28)
RDW: 12.8 % (ref 11.7–15.4)
WBC: 8.6 10*3/uL (ref 3.4–10.8)

## 2023-01-01 LAB — COMPREHENSIVE METABOLIC PANEL
ALT: 18 [IU]/L (ref 0–32)
AST: 13 [IU]/L (ref 0–40)
Albumin: 4.4 g/dL (ref 3.8–4.9)
Alkaline Phosphatase: 84 [IU]/L (ref 44–121)
BUN/Creatinine Ratio: 29 — ABNORMAL HIGH (ref 9–23)
BUN: 22 mg/dL (ref 6–24)
Bilirubin Total: <0.2 mg/dL (ref 0.0–1.2)
CO2: 25 mmol/L (ref 20–29)
Calcium: 9.4 mg/dL (ref 8.7–10.2)
Chloride: 100 mmol/L (ref 96–106)
Creatinine, Ser: 0.75 mg/dL (ref 0.57–1.00)
Globulin, Total: 2.1 g/dL (ref 1.5–4.5)
Glucose: 108 mg/dL — ABNORMAL HIGH (ref 70–99)
Potassium: 4.4 mmol/L (ref 3.5–5.2)
Sodium: 141 mmol/L (ref 134–144)
Total Protein: 6.5 g/dL (ref 6.0–8.5)
eGFR: 93 mL/min/{1.73_m2} (ref >59)

## 2023-01-01 LAB — DIPHTHERIA / TETANUS ANTIBODY PANEL
Diphtheria Ab: 1.08 [IU]/mL (ref <0.10)
Tetanus Ab, IgG: 2.52 [IU]/mL (ref <0.10)

## 2023-01-01 LAB — IGG, IGA, IGM
IgA/Immunoglobulin A, Serum: 114 mg/dL (ref 87–352)
IgG (Immunoglobin G), Serum: 957 mg/dL (ref 586–1602)
IgM (Immunoglobulin M), Srm: 203 mg/dL (ref 26–217)

## 2023-01-01 LAB — STREP PNEUMONIAE 23 SEROTYPES IGG
Pneumo Ab Type 1*: 4.3 ug/mL (ref >1.3)
Pneumo Ab Type 12 (12F)*: 0.5 ug/mL — ABNORMAL LOW (ref >1.3)
Pneumo Ab Type 14*: 11.6 ug/mL (ref >1.3)
Pneumo Ab Type 17 (17F)*: 9.5 ug/mL (ref >1.3)
Pneumo Ab Type 19 (19F)*: 7.2 ug/mL (ref >1.3)
Pneumo Ab Type 2*: 2.5 ug/mL (ref >1.3)
Pneumo Ab Type 20*: 2.3 ug/mL (ref >1.3)
Pneumo Ab Type 22 (22F)*: 1.3 ug/mL — ABNORMAL LOW (ref >1.3)
Pneumo Ab Type 23 (23F)*: 11 ug/mL (ref >1.3)
Pneumo Ab Type 26 (6B)*: 7.2 ug/mL (ref >1.3)
Pneumo Ab Type 3*: 0.3 ug/mL — ABNORMAL LOW (ref >1.3)
Pneumo Ab Type 34 (10A)*: 24.2 ug/mL (ref >1.3)
Pneumo Ab Type 4*: 0.9 ug/mL — ABNORMAL LOW (ref >1.3)
Pneumo Ab Type 43 (11A)*: 2.9 ug/mL (ref >1.3)
Pneumo Ab Type 5*: 4.7 ug/mL (ref >1.3)
Pneumo Ab Type 51 (7F)*: 8.4 ug/mL (ref >1.3)
Pneumo Ab Type 54 (15B)*: 21.7 ug/mL (ref >1.3)
Pneumo Ab Type 56 (18C)*: 2.4 ug/mL (ref >1.3)
Pneumo Ab Type 57 (19A)*: 4.7 ug/mL (ref >1.3)
Pneumo Ab Type 68 (9V)*: 1.6 ug/mL (ref >1.3)
Pneumo Ab Type 70 (33F)*: 2.9 ug/mL (ref >1.3)
Pneumo Ab Type 8*: 23 ug/mL (ref >1.3)
Pneumo Ab Type 9 (9N)*: 0.8 ug/mL — ABNORMAL LOW (ref >1.3)

## 2023-01-01 LAB — COMPLEMENT, TOTAL: Compl, Total (CH50): >60 U/mL (ref >41)

## 2023-01-01 NOTE — Progress Notes (Signed)
Can you please let this patient know that her immune screening was normal? Immunoglobulin levels WNL, tetanus and diptheria titers were protective, and pneumococcal titers were protective to 18/23 serotypes. Please have her continue to keep track of infections, antibiotic use, and steroid use. Please remind her to get a follow up chest xray. Thank you

## 2023-01-11 ENCOUNTER — Other Ambulatory Visit: Payer: Self-pay | Admitting: *Deleted

## 2023-01-11 DIAGNOSIS — M5416 Radiculopathy, lumbar region: Secondary | ICD-10-CM

## 2023-01-11 DIAGNOSIS — N951 Menopausal and female climacteric states: Secondary | ICD-10-CM

## 2023-01-11 MED ORDER — GABAPENTIN 300 MG PO CAPS
300.0000 mg | ORAL_CAPSULE | Freq: Every day | ORAL | 0 refills | Status: DC
Start: 2023-01-11 — End: 2023-03-18

## 2023-01-11 NOTE — Telephone Encounter (Signed)
Last seen on 03/18/22 Follow up scheduled on 03/18/23

## 2023-01-11 NOTE — Progress Notes (Signed)
Can you please make sure she is using a duel inhaler? Like [employer]. Thank you

## 2023-01-11 NOTE — Progress Notes (Signed)
Can you please let this patient know that her chest xray indicates no acute cardiopulmonary process. Thank you

## 2023-01-12 ENCOUNTER — Telehealth: Payer: Self-pay

## 2023-01-12 MED ORDER — FLUTICASONE-SALMETEROL 100-50 MCG/ACT IN AEPB: 1.0000 | INHALATION_SPRAY | Freq: Two times a day (BID) | RESPIRATORY_TRACT | 5 refills | Status: DC

## 2023-01-12 NOTE — Telephone Encounter (Signed)
 Spoke with pt and states that she was still using Pulmicort  until it was finish. States that she does not have Wixela.   Pt was advise that an order for Napoleon has been sent to her preferred pharmacy. Pt verbalized understanding with no other concerns or questions.

## 2023-01-14 NOTE — Telephone Encounter (Signed)
 Thank you :)

## 2023-01-15 ENCOUNTER — Other Ambulatory Visit (HOSPITAL_COMMUNITY): Payer: Self-pay

## 2023-01-15 ENCOUNTER — Other Ambulatory Visit: Payer: Self-pay | Admitting: *Deleted

## 2023-01-15 ENCOUNTER — Telehealth: Payer: Self-pay | Admitting: *Deleted

## 2023-01-15 MED ORDER — WIXELA INHUB 100-50 MCG/ACT IN AEPB: 1.0000 | INHALATION_SPRAY | Freq: Two times a day (BID) | RESPIRATORY_TRACT | 5 refills | Status: DC

## 2023-01-15 NOTE — Telephone Encounter (Signed)
 Please disregard message, I sent it in as brand name and it has been taken care of. Thank you.

## 2023-01-15 NOTE — Telephone Encounter (Signed)
 Can we please do a urgent Prior Authorization for Fluticasone-Salmeterol 100? Patient has tried and failed Asmanex, Symbicort, and Pulmicort. Thank you so much.

## 2023-03-10 DIAGNOSIS — J45909 Unspecified asthma, uncomplicated: Secondary | ICD-10-CM | POA: Diagnosis not present

## 2023-03-10 DIAGNOSIS — R058 Other specified cough: Secondary | ICD-10-CM | POA: Diagnosis not present

## 2023-03-16 NOTE — Patient Instructions (Incomplete)
 Below is our plan:  We will continue Qulipta. Increase gabapentin to 300mg  twice daily. Try taking 300mg  with dinner and 300mg  at bedtime. Continue Nurtec and or rizatriptan as needed for abortive therapy.   We can consider Vyepti or Botox in the future.   Please make sure you are staying well hydrated. I recommend 50-60 ounces daily. Well balanced diet and regular exercise encouraged. Consistent sleep schedule with 6-8 hours recommended.   Please continue follow up with care team as directed.   Follow up with me in 6-12 months   You may receive a survey regarding today's visit. I encourage you to leave honest feed back as I do use this information to improve patient care. Thank you for seeing me today!   GENERAL HEADACHE INFORMATION:   Natural supplements: Magnesium Oxide or Magnesium Glycinate 500 mg at bed (up to 800 mg daily) Coenzyme Q10 300 mg in AM Vitamin B2- 200 mg twice a day   Add 1 supplement at a time since even natural supplements can have undesirable side effects. You can sometimes buy supplements cheaper (especially Coenzyme Q10) at www.WebmailGuide.co.za or at [facility].  Migraine with aura: There is increased risk for stroke in women with migraine with aura and a contraindication for the combined contraceptive pill for use by women who have migraine with aura. The risk for women with migraine without aura is lower. However other risk factors like smoking are far more likely to increase stroke risk than migraine. There is a recommendation for no smoking and for the use of OCPs without estrogen such as progestogen only pills particularly for women with migraine with aura.Marland Kitchen People who have migraine headaches with auras may be 3 times more likely to have a stroke caused by a blood clot, compared to migraine patients who don't see auras. Women who take hormone-replacement therapy may be 30 percent more likely to suffer a clot-based stroke than women not taking medication containing  estrogen. Other risk factors like smoking and high blood pressure may be  much more important.    Vitamins and herbs that show potential:   Magnesium: Magnesium (250 mg twice a day or 500 mg at bed) has a relaxant effect on smooth muscles such as blood vessels. Individuals suffering from frequent or daily headache usually have low magnesium levels which can be increase with daily supplementation of 400-750 mg. Three trials found 40-90% average headache reduction  when used as a preventative. Magnesium may help with headaches are aura, the best evidence for magnesium is for migraine with aura is its thought to stop the cortical spreading depression we believe is the pathophysiology of migraine aura.Magnesium also demonstrated the benefit in menstrually related migraine.  Magnesium is part of the messenger system in the serotonin cascade and it is a good muscle relaxant.  It is also useful for constipation which can be a side effect of other medications used to treat migraine. Good sources include nuts, whole grains, and tomatoes. Side Effects: loose stool/diarrhea  Riboflavin (vitamin B 2) 200 mg twice a day. This vitamin assists nerve cells in the production of ATP a principal energy storing molecule.  It is necessary for many chemical reactions in the body.  There have been at least 3 clinical trials of riboflavin using 400 mg per day all of which suggested that migraine frequency can be decreased.  All 3 trials showed significant improvement in over half of migraine sufferers.  The supplement is found in bread, cereal, milk, meat, and poultry.  Most Americans get more riboflavin than the recommended daily allowance, however riboflavin deficiency is not necessary for the supplements to help prevent headache. Side effects: energizing, green urine   Coenzyme Q10: This is present in almost all cells in the body and is critical component for the conversion of energy.  Recent studies have shown that a  nutritional supplement of CoQ10 can reduce the frequency of migraine attacks by improving the energy production of cells as with riboflavin.  Doses of 150 mg twice a day have been shown to be effective.   Melatonin: Increasing evidence shows correlation between melatonin secretion and headache conditions.  Melatonin supplementation has decreased headache intensity and duration.  It is widely used as a sleep aid.  Sleep is natures way of dealing with migraine.  A dose of 3 mg is recommended to start for headaches including cluster headache. Higher doses up to 15 mg has been reviewed for use in Cluster headache and have been used. The rationale behind using melatonin for cluster is that many theories regarding the cause of Cluster headache center around the disruption of the normal circadian rhythm in the brain.  This helps restore the normal circadian rhythm.   HEADACHE DIET: Foods and beverages which may trigger migraine Note that only 20% of headache patients are food sensitive. You will know if you are food sensitive if you get a headache consistently 20 minutes to 2 hours after eating a certain food. Only cut out a food if it causes headaches, otherwise you might remove foods you enjoy! What matters most for diet is to eat a well balanced healthy diet full of vegetables and low fat protein, and to not miss meals.   Chocolate, other sweets ALL cheeses except cottage and cream cheese Dairy products, yogurt, sour cream, ice cream Liver Meat extracts (Bovril, Marmite, meat tenderizers) Meats or fish which have undergone aging, fermenting, pickling or smoking. These include: Hotdogs,salami,Lox,sausage, mortadellas,smoked salmon, pepperoni, Pickled herring Pods of broad bean (English beans, Chinese pea pods, [place] (fava) beans, lima and navy beans Ripe avocado, ripe banana Yeast extracts or active yeast preparations such as Brewer's or Fleishman's (commercial bakes goods are permitted) Tomato based  foods, pizza (lasagna, etc.)   MSG (monosodium glutamate) is disguised as many things; look for these common aliases: Monopotassium glutamate Autolysed yeast Hydrolysed protein Sodium caseinate "flavorings" "all natural preservatives" Nutrasweet   Avoid all other foods that convincingly provoke headaches.   Resources: The Dizzy [place]air Laundry Your Headache Diet, migrainestrong.com  https://zamora-andrews.com/   Caffeine and Migraine For patients that have migraine, caffeine intake more than 3 days per week can lead to dependency and increased migraine frequency. I would recommend cutting back on your caffeine intake as best you can. The recommended amount of caffeine is 200-300 mg daily, although migraine patients may experience dependency at even lower doses. While you may notice an increase in headache temporarily, cutting back will be helpful for headaches in the long run. For more information on caffeine and migraine, visit: https://americanmigrainefoundation.org/resource-library/caffeine-and-migraine/   Headache Prevention Strategies:   1. Maintain a headache diary; learn to identify and avoid triggers.  - This can be a simple note where you log when you had a headache, associated symptoms, and medications used - There are several smartphone apps developed to help track migraines: Migraine Buddy, Migraine Monitor, Curelator N1-Headache App   Common triggers include: Emotional triggers: Emotional/Upset family or friends Emotional/Upset occupation Business reversal/success Anticipation anxiety Crisis-serious Post-crisis periodNew job/position   Physical triggers: Vacation Day  Weekend Strenuous Exercise High Altitude Location New Move Menstrual Day Physical Illness Oversleep/Not enough sleep Weather changes Light: Photophobia or light sesnitivity treatment involves a balance between desensitization and reduction in overly strong  input. Use dark polarized glasses outside, but not inside. Avoid bright or fluorescent light, but do not dim environment to the point that going into a normally lit room hurts. Consider FL-41 tint lenses, which reduce the most irritating wavelengths without blocking too much light.  These can be obtained at axonoptics.com or theraspecs.com Foods: see list above.   2. Limit use of acute treatments (over-the-counter medications, triptans, etc.) to no more than 2 days per week or 10 days per month to prevent medication overuse headache (rebound headache).     3. Follow a regular schedule (including weekends and holidays): Don't skip meals. Eat a balanced diet. 8 hours of sleep nightly. Minimize stress. Exercise 30 minutes per day. Being overweight is associated with a 5 times increased risk of chronic migraine. Keep well hydrated and drink 6-8 glasses of water per day.   4. Initiate non-pharmacologic measures at the earliest onset of your headache. Rest and quiet environment. Relax and reduce stress. Breathe2Relax is a free app that can instruct you on    some simple relaxtion and breathing techniques. Http://Dawnbuse.com is a    free website that provides teaching videos on relaxation.  Also, there are  many apps that   can be downloaded for "mindful" relaxation.  An app called YOGA NIDRA will help walk you through mindfulness. Another app called Calm can be downloaded to give you a structured mindfulness guide with daily reminders and skill development. Headspace for guided meditation Mindfulness Based Stress Reduction Online Course: www.palousemindfulness.com Cold compresses.   5. Don't wait!! Take the maximum allowable dosage of prescribed medication at the first sign of migraine.   6. Compliance:  Take prescribed medication regularly as directed and at the first sign of a migraine.   7. Communicate:  Call your physician when problems arise, especially if your headaches change, increase in  frequency/severity, or become associated with neurological symptoms (weakness, numbness, slurred speech, etc.). Proceed to emergency room if you experience new or worsening symptoms or symptoms do not resolve, if you have new neurologic symptoms or if headache is severe, or for any concerning symptom.   8. Headache/pain management therapies: Consider various complementary methods, including medication, behavioral therapy, psychological counselling, biofeedback, massage therapy, acupuncture, dry needling, and other modalities.  Such measures may reduce the need for medications. Counseling for pain management, where patients learn to function and ignore/minimize their pain, seems to work very well.   9. Recommend changing family's attention and focus away from patient's headaches. Instead, emphasize daily activities. If first question of day is 'How are your headaches/Do you have a headache today?', then patient will constantly think about headaches, thus making them worse. Goal is to re-direct attention away from headaches, toward daily activities and other distractions.   10. Helpful Websites: www.AmericanHeadacheSociety.org PatentHood.ch www.headaches.org TightMarket.nl www.achenet.org
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
## 2023-03-18 ENCOUNTER — Encounter: Payer: Self-pay | Admitting: Family Medicine

## 2023-03-18 ENCOUNTER — Other Ambulatory Visit: Payer: Self-pay | Admitting: Family Medicine

## 2023-03-18 ENCOUNTER — Ambulatory Visit: Payer: BC Managed Care – PPO | Admitting: Family Medicine

## 2023-03-18 VITALS — BP 125/75 | HR 75 | Ht 67.0 in | Wt 181.2 lb

## 2023-03-18 DIAGNOSIS — N951 Menopausal and female climacteric states: Secondary | ICD-10-CM

## 2023-03-18 DIAGNOSIS — M5416 Radiculopathy, lumbar region: Secondary | ICD-10-CM

## 2023-03-18 DIAGNOSIS — G43711 Chronic migraine without aura, intractable, with status migrainosus: Secondary | ICD-10-CM

## 2023-03-18 DIAGNOSIS — G43009 Migraine without aura, not intractable, without status migrainosus: Secondary | ICD-10-CM

## 2023-03-18 MED ORDER — NURTEC 75 MG PO TBDP
ORAL_TABLET | ORAL | 11 refills | Status: DC
Start: 2023-03-18 — End: 2023-10-04

## 2023-03-18 MED ORDER — QULIPTA 60 MG PO TABS: 60.0000 mg | ORAL_TABLET | Freq: Every day | ORAL | 1 refills | Status: DC

## 2023-03-18 MED ORDER — RIZATRIPTAN BENZOATE 10 MG PO TBDP
10.0000 mg | ORAL_TABLET | ORAL | 11 refills | Status: DC | PRN
Start: 2023-03-18 — End: 2023-10-04

## 2023-03-18 MED ORDER — GABAPENTIN 300 MG PO CAPS
300.0000 mg | ORAL_CAPSULE | Freq: Two times a day (BID) | ORAL | 3 refills | Status: DC
Start: 2023-03-18 — End: 2023-06-09

## 2023-03-23 NOTE — Patient Instructions (Incomplete)
  1. Treat and prevent inflammation of airway:   A. Flonase - 2 inhalations 1-2 times per day  B. Wixela - 1 inhalation 2 times per day  C. Use AirSupra 2 puffs twice a day for 1-2 weeks, then as needed  2. Treat and prevent reflux induced respiratory inflammation:   A. Minimize caffeine consumption  B. Replace throat clearing with swallowing/drinking maneuver  C. Continue pantoprazole 40 mg - 1 tablet 2 times per day  D. Continue famotidine 40 mg - 1 tablet 1 time per day   E. Refer to GI specialist for evaluation of reflux  3. Treat and prevent headache / sleep dysfunction:   A. Minimize caffeine consumption  B. Periactin 4 mg - 1/2-1 tablet at bedtime  4. If needed:   A. AirSupra - 2 inhalations every 4-6 hours  B. Allegra  5. Recurrent infections A. lab evaluation. Will call with results B. Follow up chest xray on 01/01/2023  6. Follow up in 3 months or sooner if needed

## 2023-03-23 NOTE — Progress Notes (Signed)
 [address] [place] [address] Dept: [phone]  FOLLOW UP NOTE  Patient ID: Meredith Blackwell, female    DOB: 1965/04/03  Age: 58 y.o. MRN: [id number] Date of Office Visit: 03/25/2023  Assessment  Chief Complaint: Follow-up and Asthma  HPI Meredith Blackwell is a 58 year old female who presents to the clinic for a follow up visit. She was last seen in this clinic on 12/25/2022 by Thermon Leyland, FNP for evaluation of asthma, allergic rhinitis, reflux, recurrent infection with normal workup, headache, and sleep dysfunction.  In the interim, she visited her primary care provider for asthma flare requiring a 10-day prednisone taper for relief of symptoms.  At that time, she denied recent fever, sweats, chills, or sick contacts.  Chest x-ray indicates no acute cardiopulmonary disease.  Of note, immunoglobulins within normal limits, tetanus and diphtheria titers protective, and pneumococcal titers protective to 18 out of 23 serotypes on 12/25/2022.  At today's visit, she reports her asthma has not been well controlled with dry cough that began about 2 weeks ago. She reports occasional shortness of breath with activity such as climbing 2 flights of stairs. She denies wheeze.  She continues Wixela 100-1 puff every morning and occasionally takes 1 puff in the evening.  She reports that she rarely uses Airsupra for relief of symptoms.  She does report difficulty using her inhalers while experiencing cough.  Of note, absolute eosinophil count 100 on 12/25/2022.  Allergic rhinitis is reported as moderately well-controlled with occasional nasal congestion as the main symptom.  She continues Allegra 180 mg once a day and uses Flonase daily.  She is not currently using a nasal saline rinse.  Previous environmental allergy testing was reported to be positive to cats, dust mite, and possibly pollen.  No recent allergy testing is available for review.  Reflux is reported as moderately well-controlled, however, she does  report a frequent globus or burning sensation in her throat as well as occasional wheeze occurring in her throat.  She makes it clear distinction that the wheezes in her throat not from her lungs.  She continues pantoprazole 40 mg twice a day and famotidine once a day.  She is not currently seeing a GI specialist, however, she is agreeable to referral to GI specialist at this time.  She continues to experience some sleep disturbance and occasionally takes Periactin infrequently after which she reports on fractured sleep.  She continues to follow-up with Dr. Trevor Mace office for management of migraine.  Her current medications are listed in the chart.  Drug Allergies:  Allergies  Allergen Reactions   Doxycycline    Septra [Sulfamethoxazole-Trimethoprim]     Physical Exam: BP 114/62 (BP Location: Right Arm, Patient Position: Sitting, Cuff Size: Normal)   Pulse 80   Temp 98.2 F (36.8 C) (Temporal)   Resp 16   SpO2 95%    Physical Exam Vitals reviewed.  Constitutional:      Appearance: Normal appearance.  HENT:     Head: Normocephalic and atraumatic.     Right Ear: Tympanic membrane normal.     Left Ear: Tympanic membrane normal.     Nose:     Comments: Bilateral nares erythematous with thin clear nasal drainage noted.  Pharynx slightly erythematous with no exudate.  Ears normal.  Eyes normal. Eyes:     Conjunctiva/sclera: Conjunctivae normal.  Cardiovascular:     Rate and Rhythm: Normal rate and regular rhythm.     Heart sounds: Normal heart sounds.  No murmur heard. Pulmonary:     Effort: Pulmonary effort is normal.     Breath sounds: Normal breath sounds.     Comments: Lungs clear to auscultation Musculoskeletal:        General: Normal range of motion.     Cervical back: Normal range of motion and neck supple.  Skin:    General: Skin is warm and dry.  Neurological:     Mental Status: She is alert and oriented to person, place, and time.  Psychiatric:        Mood and  Affect: Mood normal.        Behavior: Behavior normal.        Thought Content: Thought content normal.        Judgment: Judgment normal.     Diagnostics: FVC 2.68 which is 79% of predicted value, FEV1 2.34 which is 87% of predicted value.  Spirometry indicates normal ventilatory function.  Assessment and Plan: 1. Not well controlled moderate persistent asthma with acute exacerbation   2. Recurrent infections   3. Migraine syndrome   4. Dysfunction of sleep stage or arousal   5. LPRD (laryngopharyngeal reflux disease)   6. Seasonal and perennial allergic rhinitis     Meds ordered this encounter  Medications   Albuterol-Budesonide (AIRSUPRA) 90-80 MCG/ACT AERO    Sig: Inhale 2 Inhalations into the lungs every 4 (four) hours as needed.    Dispense:  11 g    Refill:  1    [id number]   famotidine (PEPCID) 40 MG tablet    Sig: Take 1 tablet (40 mg total) by mouth in the morning.    Dispense:  90 tablet    Refill:  1   pantoprazole (PROTONIX) 40 MG tablet    Sig: Take 1 tablet (40 mg total) by mouth 2 (two) times daily.    Dispense:  180 tablet    Refill:  1   WIXELA INHUB 100-50 MCG/ACT AEPB    Sig: Inhale 1 puff into the lungs 2 (two) times daily.    Dispense:  60 each    Refill:  5   budesonide (PULMICORT) 0.5 MG/2ML nebulizer solution    Sig: Take 2 mLs (0.5 mg total) by nebulization 2 (two) times daily as needed.    Dispense:  120 mL    Refill:  2   albuterol (PROVENTIL) (2.5 MG/3ML) 0.083% nebulizer solution    Sig: Take 3 mLs (2.5 mg total) by nebulization every 4 (four) hours as needed for wheezing or shortness of breath.    Dispense:  75 mL    Refill:  1    Patient Instructions   1. Treat and prevent inflammation of airway:   A. Flonase - 2 inhalations 1-2 times per day  B. Wixela - 1 inhalation 2 times per day  C. Use AirSupra 2 puffs as needed  2. Treat and prevent reflux induced respiratory inflammation:   A. Minimize  caffeine consumption  B. Replace throat clearing with swallowing/drinking maneuver  C. Continue pantoprazole 40 mg - 2 times per day  D. Continue famotidine 40 mg - 1 tablet 1 time per day   E. Refer to GI specialist for evaluation of reflux  3. Treat and prevent headache / sleep dysfunction:   A. Minimize caffeine consumption  B. Periactin 4 mg - 1/2-1 tablet at bedtime  4. If needed:   A. AirSupra - 2 inhalations every 4-6 hours  B. Allegra daily for allergy control  C. For asthma flare with cough, stop AirSupra and begin nebulized treatments- albuterol once every 4-6 hours and Pulmicort 0.5 twice a day in addition to Wixela twice a day  5. Recurrent infections A. Continue to keep track of infection, antibiotic use and steroid use  6. Follow up in 3 months or sooner if needed  Return in about 3 months (around 06/25/2023), or if symptoms worsen or fail to improve.    Thank you for the opportunity to care for this patient.  Please do not hesitate to contact me with questions.  Thermon Leyland, FNP [facility]

## 2023-03-25 ENCOUNTER — Ambulatory Visit (INDEPENDENT_AMBULATORY_CARE_PROVIDER_SITE_OTHER): Payer: BC Managed Care – PPO | Admitting: Family Medicine

## 2023-03-25 ENCOUNTER — Telehealth: Payer: Self-pay | Admitting: *Deleted

## 2023-03-25 ENCOUNTER — Encounter: Payer: Self-pay | Admitting: Internal Medicine

## 2023-03-25 ENCOUNTER — Other Ambulatory Visit: Payer: Self-pay | Admitting: Internal Medicine

## 2023-03-25 ENCOUNTER — Encounter: Payer: Self-pay | Admitting: Family Medicine

## 2023-03-25 ENCOUNTER — Other Ambulatory Visit: Payer: Self-pay

## 2023-03-25 VITALS — BP 114/62 | HR 80 | Temp 98.2°F | Resp 16

## 2023-03-25 DIAGNOSIS — G4720 Circadian rhythm sleep disorder, unspecified type: Secondary | ICD-10-CM

## 2023-03-25 DIAGNOSIS — J45991 Cough variant asthma: Secondary | ICD-10-CM | POA: Insufficient documentation

## 2023-03-25 DIAGNOSIS — Z1231 Encounter for screening mammogram for malignant neoplasm of breast: Secondary | ICD-10-CM

## 2023-03-25 DIAGNOSIS — J4530 Mild persistent asthma, uncomplicated: Secondary | ICD-10-CM | POA: Diagnosis not present

## 2023-03-25 DIAGNOSIS — B999 Unspecified infectious disease: Secondary | ICD-10-CM | POA: Insufficient documentation

## 2023-03-25 DIAGNOSIS — J45909 Unspecified asthma, uncomplicated: Secondary | ICD-10-CM | POA: Insufficient documentation

## 2023-03-25 DIAGNOSIS — J3089 Other allergic rhinitis: Secondary | ICD-10-CM

## 2023-03-25 DIAGNOSIS — K219 Gastro-esophageal reflux disease without esophagitis: Secondary | ICD-10-CM

## 2023-03-25 DIAGNOSIS — J4541 Moderate persistent asthma with (acute) exacerbation: Secondary | ICD-10-CM

## 2023-03-25 DIAGNOSIS — G43909 Migraine, unspecified, not intractable, without status migrainosus: Secondary | ICD-10-CM

## 2023-03-25 DIAGNOSIS — J302 Other seasonal allergic rhinitis: Secondary | ICD-10-CM

## 2023-03-25 DIAGNOSIS — N6320 Unspecified lump in the left breast, unspecified quadrant: Secondary | ICD-10-CM

## 2023-03-25 MED ORDER — FAMOTIDINE 40 MG PO TABS: 40.0000 mg | ORAL_TABLET | Freq: Every morning | ORAL | 1 refills | Status: DC

## 2023-03-25 MED ORDER — PANTOPRAZOLE SODIUM 40 MG PO TBEC: 40.0000 mg | DELAYED_RELEASE_TABLET | Freq: Two times a day (BID) | ORAL | 1 refills | Status: DC

## 2023-03-25 MED ORDER — AIRSUPRA 90-80 MCG/ACT IN AERO: 2.0000 | INHALATION_SPRAY | RESPIRATORY_TRACT | 1 refills | Status: DC | PRN

## 2023-03-25 MED ORDER — ALBUTEROL SULFATE (2.5 MG/3ML) 0.083% IN NEBU: 2.5000 mg | INHALATION_SOLUTION | RESPIRATORY_TRACT | 1 refills | Status: DC | PRN

## 2023-03-25 MED ORDER — WIXELA INHUB 100-50 MCG/ACT IN AEPB: 1.0000 | INHALATION_SPRAY | Freq: Two times a day (BID) | RESPIRATORY_TRACT | 5 refills | Status: DC

## 2023-03-25 MED ORDER — BUDESONIDE 0.5 MG/2ML IN SUSP: 0.5000 mg | Freq: Two times a day (BID) | RESPIRATORY_TRACT | 2 refills | Status: DC | PRN

## 2023-03-25 NOTE — Telephone Encounter (Signed)
 Per Thurston Hole refer to GI specialist for evaluation of reflux please.

## 2023-03-29 ENCOUNTER — Ambulatory Visit
Admission: RE | Admit: 2023-03-29 | Discharge: 2023-03-29 | Disposition: A | Discharge status: Home / Self Care | Source: Ambulatory Visit | Attending: Internal Medicine | Admitting: Internal Medicine

## 2023-03-29 DIAGNOSIS — Z1231 Encounter for screening mammogram for malignant neoplasm of breast: Secondary | ICD-10-CM | POA: Diagnosis not present

## 2023-03-29 HISTORY — DX: Encounter for nonprocreative screening for genetic disease carrier status: Z13.71

## 2023-04-02 ENCOUNTER — Other Ambulatory Visit: Payer: Self-pay | Admitting: Internal Medicine

## 2023-04-02 DIAGNOSIS — N63 Unspecified lump in unspecified breast: Secondary | ICD-10-CM

## 2023-04-04 DIAGNOSIS — N644 Mastodynia: Secondary | ICD-10-CM | POA: Diagnosis not present

## 2023-04-04 DIAGNOSIS — N61 Mastitis without abscess: Secondary | ICD-10-CM | POA: Diagnosis not present

## 2023-04-08 DIAGNOSIS — S92912A Unspecified fracture of left toe(s), initial encounter for closed fracture: Secondary | ICD-10-CM | POA: Diagnosis not present

## 2023-04-08 DIAGNOSIS — M79672 Pain in left foot: Secondary | ICD-10-CM | POA: Diagnosis not present

## 2023-04-23 DIAGNOSIS — R7301 Impaired fasting glucose: Secondary | ICD-10-CM | POA: Diagnosis not present

## 2023-04-23 DIAGNOSIS — D229 Melanocytic nevi, unspecified: Secondary | ICD-10-CM | POA: Diagnosis not present

## 2023-04-28 ENCOUNTER — Encounter: Payer: Self-pay | Admitting: Internal Medicine

## 2023-05-05 ENCOUNTER — Telehealth: Payer: Self-pay

## 2023-05-05 ENCOUNTER — Other Ambulatory Visit (HOSPITAL_COMMUNITY): Payer: Self-pay

## 2023-05-05 NOTE — Telephone Encounter (Signed)
 Pharmacy Patient Advocate Encounter   Received notification from CoverMyMeds that prior authorization for Nurtec 75MG  dispersible tablets is required/requested.   Insurance verification completed.   The patient is insured through [employer] .   Per test claim: PA required; PA submitted to above mentioned insurance via CoverMyMeds Key/confirmation #/EOC [id number] Status is pending

## 2023-05-05 NOTE — Telephone Encounter (Signed)
 Pharmacy Patient Advocate Encounter  Received notification from [facility] that Prior Authorization for Nurtec 75MG  dispersible tablets has been APPROVED from 05/05/2023 to 05/04/2024. Ran test claim, Copay is $0. This test claim was processed through [facility]- copay amounts may vary at other pharmacies due to pharmacy/plan contracts, or as the patient moves through the different stages of their insurance plan.   PA #/Case ID/Reference #: PA Case ID #: [id number]

## 2023-05-07 ENCOUNTER — Ambulatory Visit
Admission: RE | Admit: 2023-05-07 | Discharge: 2023-05-07 | Disposition: A | Discharge status: Home / Self Care | Source: Ambulatory Visit | Attending: Internal Medicine | Admitting: Internal Medicine

## 2023-05-07 DIAGNOSIS — N63 Unspecified lump in unspecified breast: Secondary | ICD-10-CM

## 2023-05-07 DIAGNOSIS — N6341 Unspecified lump in right breast, subareolar: Secondary | ICD-10-CM | POA: Diagnosis not present

## 2023-05-07 MED ORDER — GADOPICLENOL 0.5 MMOL/ML IV SOLN
7.5000 mL | Freq: Once | INTRAVENOUS | Status: AC | PRN
  Administered 2023-05-07: 7.5 mL via INTRAVENOUS

## 2023-05-11 DIAGNOSIS — H40033 Anatomical narrow angle, bilateral: Secondary | ICD-10-CM | POA: Diagnosis not present

## 2023-05-12 ENCOUNTER — Other Ambulatory Visit: Payer: Self-pay | Admitting: Family Medicine

## 2023-05-12 ENCOUNTER — Telehealth: Payer: Self-pay

## 2023-05-12 ENCOUNTER — Other Ambulatory Visit (HOSPITAL_COMMUNITY): Payer: Self-pay

## 2023-05-12 NOTE — Telephone Encounter (Addendum)
 Insurance will only approve Qulipta   30/30

## 2023-05-12 NOTE — Telephone Encounter (Signed)
 Called pharmacy made them aware only fill  qulipta   30/30

## 2023-05-12 NOTE — Telephone Encounter (Signed)
   Pharmacy Patient Advocate Encounter   Received notification from Physician's Office that prior authorization for Qulipta  is required/requested.   Insurance verification completed.   The patient is insured through [facility] .   Per test claim: The current 30 day co-pay is, $0.  No PA needed at this time. This test claim was processed through [facility]- copay amounts may vary at other pharmacies due to pharmacy/plan contracts, or as the patient moves through the different stages of their insurance plan.

## 2023-05-12 NOTE — Telephone Encounter (Signed)
 The pharmacy stated this below. Is it going through for 90/90?

## 2023-05-12 NOTE — Telephone Encounter (Signed)
 It is going thru for 30 day supply, so 30 tablets for 30 days.

## 2023-05-12 NOTE — Telephone Encounter (Addendum)
     Her plan approved for a 30DS at a time.Is she trying to get a 90DS at one time? She has been filling fore 30DS

## 2023-05-31 DIAGNOSIS — M25551 Pain in right hip: Secondary | ICD-10-CM | POA: Diagnosis not present

## 2023-05-31 DIAGNOSIS — Z6828 Body mass index (BMI) 28.0-28.9, adult: Secondary | ICD-10-CM | POA: Diagnosis not present

## 2023-05-31 DIAGNOSIS — H40013 Open angle with borderline findings, low risk, bilateral: Secondary | ICD-10-CM | POA: Diagnosis not present

## 2023-05-31 DIAGNOSIS — M5416 Radiculopathy, lumbar region: Secondary | ICD-10-CM | POA: Diagnosis not present

## 2023-06-01 ENCOUNTER — Other Ambulatory Visit: Payer: Self-pay | Admitting: Neurosurgery

## 2023-06-01 DIAGNOSIS — M5416 Radiculopathy, lumbar region: Secondary | ICD-10-CM

## 2023-06-02 ENCOUNTER — Encounter: Payer: Self-pay | Admitting: Neurosurgery

## 2023-06-06 NOTE — Progress Notes (Unsigned)
 Meredith Blackwell, female    DOB: 07/11/1965    MRN: [id number]   Brief patient profile:  61  yowf never smoker started coughing in college (? While on BCPs) and daily since then  eval by Kozlow mold cats rag weed  self -referred to pulmonary clinic in [facility]  06/09/2023 for daily cough.   History of Present Illness  06/09/2023  Pulmonary/ 1st office eval/ Meredith Blackwell / [facility] wixella/ pred Chief Complaint  Patient presents with   Establish Care  Dyspnea:  cardio class , cough worse when stops  Cough: as soon as gets up out of bed in am min mucoid yellow thick  Sleep: bed is flat 2 pillows and does fine between flares most recent x 2 weeks  SABA use: air suprax  not using / nor neb equivalent  02: none ; LDSCT:noone   No obvious day to day or daytime pattern/variability or assoc excess/ purulent sputum or mucus plugs or hemoptysis or cp or chest tightness,  or overt  HB  symptoms.    Also denies any obvious fluctuation of symptoms with weather or environmental changes or other aggravating or alleviating factors except as outlined above   No unusual exposure hx or h/o childhood pna/ asthma or knowledge of premature birth.  Current Allergies, Complete Past Medical History, Past Surgical History, Family History, and Social History were reviewed in [employer] record.  ROS  The following are not active complaints unless bolded Hoarseness, sore throat, dysphagia= globus, dental problems, itching, sneezing,  nasal congestion or discharge of excess mucus or purulent secretions, ear ache,   fever, chills, sweats, unintended wt loss or wt gain, classically pleuritic or exertional cp,  orthopnea pnd or arm/hand swelling  or leg swelling, presyncope, palpitations, abdominal pain, anorexia, nausea, vomiting, diarrhea  or change in bowel habits or change in bladder habits, change in stools or change in urine, dysuria, hematuria,  rash, arthralgias, visual complaints,  headache, numbness, weakness or ataxia or problems with walking or coordination,  change in mood or  memory.            Outpatient Medications Prior to Visit  Medication Sig Dispense Refill   Albuterol -Budesonide  (AIRSUPRA ) 90-80 MCG/ACT AERO Inhale 2 Inhalations into the lungs every 4 (four) hours as needed. 11 g 1   benzonatate (TESSALON) 100 MG capsule 1 capsule as needed for cough Orally Three times a day for 10 days     Cholecalciferol 50 MCG (2000 UT) CAPS Take 2,000 Units by mouth daily.     Continuous Glucose Sensor (DEXCOM G7 SENSOR) MISC 1 each by Does not apply route once a week. Every ten days.     cyproheptadine  (PERIACTIN ) 4 MG tablet Take 0.5 tablets (2 mg total) by mouth at bedtime. 30 tablet 5   famotidine  (PEPCID ) 40 MG tablet Take 1 tablet (40 mg total) by mouth in the morning. 90 tablet 1   fluticasone  (FLONASE) 50 MCG/ACT nasal spray Place into both nostrils daily.     gabapentin  (NEURONTIN ) 300 MG capsule Take 1 capsule (300 mg total) by mouth 2 (two) times daily. At dinner and at bedtime 180 capsule 3   ibuprofen (ADVIL) 200 MG tablet Take 200 mg by mouth as needed.     indomethacin  (INDOCIN ) 25 MG capsule Take 1 capsule (25 mg total) by mouth 3 (three) times daily as needed. 30 capsule 1   Melatonin 3 MG TABS Take by mouth.     ondansetron  (ZOFRAN -ODT) 4  MG disintegrating tablet DISSOLVE 1 TO 2 TABLETS ON THE TONGUE EVERY 8 HOURS AS NEEDED FOR NAUSEA. MAY ALSO TAKE WITH MAXALT  FOR MIGRAINE 12 tablet 3   pantoprazole  (PROTONIX ) 40 MG tablet Take 1 tablet (40 mg total) by mouth 2 (two) times daily. 180 tablet 1   predniSONE (DELTASONE) 10 MG tablet Take 10 mg by mouth.     promethazine-dextromethorphan (PROMETHAZINE-DM) 6.25-15 MG/5ML syrup Take by mouth.     QULIPTA  60 MG TABS TAKE 1 TABLET BY MOUTH EVERY DAY 90 tablet 1   raloxifene (EVISTA) 60 MG tablet raloxifene 60 mg tablet  TAKE 1 TABLET BY MOUTH ONCE DAILY     Rimegepant Sulfate (NURTEC) 75 MG TBDP TAKE 1 TABLET  (75 MG) BY MOUTH DAILY AS NEEDED FOR MIGRAINES. TAKE AS CLOSE TO ONSET OF MIGRAINE AS POSSIBLE. ONE DAILY MAXIMUM. 16 tablet 11   rizatriptan  (MAXALT -MLT) 10 MG disintegrating tablet Take 1 tablet (10 mg total) by mouth as needed for migraine. May repeat in 2 hours if needed 9 tablet 11   vitamin C (ASCORBIC ACID) 500 MG tablet Take 500 mg by mouth daily.     WIXELA INHUB  100-50 MCG/ACT AEPB Inhale 1 puff into the lungs 2 (two) times daily. 60 each 5   albuterol  (PROVENTIL ) (2.5 MG/3ML) 0.083% nebulizer solution Take 3 mLs (2.5 mg total) by nebulization every 4 (four) hours as needed for wheezing or shortness of breath. (Patient not taking: Reported on 06/09/2023) 75 mL 1   budesonide  (PULMICORT ) 0.5 MG/2ML nebulizer solution Take 2 mLs (0.5 mg total) by nebulization 2 (two) times daily as needed. (Patient not taking: Reported on 06/09/2023) 120 mL 2   No facility-administered medications prior to visit.    Past Medical History:  Diagnosis Date   Abdominal pain    Anal spasm    Anemia    Asthma    BRCA gene mutation negative    Constipation    Diarrhea    Dysphagia    Environmental and seasonal allergies    Family history of bladder cancer    Family history of breast cancer    Family history of melanoma    Family history of ovarian cancer    GERD (gastroesophageal reflux disease)    Glaucoma    Hemorrhoid    Nausea       Objective:     BP 137/84 (BP Location: Left Arm)   Pulse 82   Ht 5\' 7"  (1.702 m)   Wt 181 lb 12.8 oz (82.5 kg)   SpO2 93% Comment: RA  BMI 28.47 kg/m   SpO2: 93 % (RA)  Amb wf nad with incessant dry sounding cough     HEENT : Oropharynx  clear      Nasal turbinates nl    NECK :  without  apparent JVD/ palpable Nodes/TM    LUNGS: no acc muscle use,  Nl contour chest which is clear to A and P bilaterally with cough / end  wheeze   CV:  RRR  no s3 or murmur or increase in P2, and no edema   ABD:  soft and nontender   MS:  Gait nl   ext warm  without deformities Or obvious joint restrictions  calf tenderness, cyanosis or clubbing    SKIN: warm and dry without lesions    NEURO:  alert, approp, nl sensorium with  no motor or cerebellar deficits apparent.      Cxr 06/07/23  nl  Assessment   Cough variant  asthma Onset in College with multiple allegies per Dr Jerelene Monday - D/c wixella 06/09/2023 >>>  - startee daily neb bud/ saba with max gerd rx 06/09/2023 >>>   Asthma vs Upper airway cough syndrome (previously labeled PNDS),  is so named because it's frequently impossible to sort out how much is  CR/sinusitis with freq throat clearing (which can be related to primary GERD)   vs  causing  secondary (" extra esophageal")  GERD from wide swings in gastric pressure that occur with throat clearing, often  promoting self use of mint and menthol lozenges that reduce the lower esophageal sphincter tone and exacerbate the problem further in a cyclical fashion.   These are the same pts (now being labeled as having "irritable larynx syndrome" by some cough centers) who not infrequently have a history of having failed to tolerate ace inhibitors,  dry powder inhalers or biphosphonates or report having atypical/extraesophageal reflux symptoms LPR that don't respond to standard doses of PPI  and are easily confused as having aecopd or asthma flares by even experienced allergists/ pulmonologists (myself included).    Of the three most common causes of  Sub-acute / recurrent or chronic cough, only one (GERD)  can actually contribute to/ trigger  the other two (asthma and post nasal drip syndrome)  and perpetuate the cylce of cough.  While not intuitively obvious, many patients with chronic low grade reflux do not cough until there is a primary insult that disturbs the protective epithelial barrier and exposes sensitive nerve endings.   This is typically viral but can due to PNDS and  either may apply here.   The point is that once this occurs, it is difficult  to eliminate the cycle  using anything but a maximally effective acid suppression regimen at least in the short run, accompanied by an appropriate diet to address non acid GERD and control / eliminate the cough itself with gabapentin  300 mg tid and rx with asthma component with neb pulm/bud with f/u in 4-6 weeks either in [place] or [place]  Discussed in detail all the  indications, usual  risks and alternatives  relative to the benefits with patient who agrees to proceed with Rx as outlined.             Each maintenance medication was reviewed in detail including emphasizing most importantly the difference between maintenance and prns and under what circumstances the prns are to be triggered using an action plan format where appropriate.  Total time for H and P, chart review, counseling, reviewing hfa/neb  device(s) and generating customized AVS unique to this office visit / same day charting = 62 min new pt eval           Vernestine Gondola, MD 06/09/2023

## 2023-06-07 DIAGNOSIS — R051 Acute cough: Secondary | ICD-10-CM | POA: Diagnosis not present

## 2023-06-07 DIAGNOSIS — J019 Acute sinusitis, unspecified: Secondary | ICD-10-CM | POA: Diagnosis not present

## 2023-06-07 DIAGNOSIS — J209 Acute bronchitis, unspecified: Secondary | ICD-10-CM | POA: Diagnosis not present

## 2023-06-09 ENCOUNTER — Ambulatory Visit: Admitting: Internal Medicine

## 2023-06-09 ENCOUNTER — Encounter: Payer: Self-pay | Admitting: Internal Medicine

## 2023-06-09 VITALS — BP 137/84 | HR 82 | Ht 67.0 in | Wt 181.8 lb

## 2023-06-09 DIAGNOSIS — M5416 Radiculopathy, lumbar region: Secondary | ICD-10-CM

## 2023-06-09 DIAGNOSIS — J45991 Cough variant asthma: Secondary | ICD-10-CM

## 2023-06-09 DIAGNOSIS — K219 Gastro-esophageal reflux disease without esophagitis: Secondary | ICD-10-CM

## 2023-06-09 DIAGNOSIS — N951 Menopausal and female climacteric states: Secondary | ICD-10-CM

## 2023-06-09 MED ORDER — GABAPENTIN 300 MG PO CAPS
300.0000 mg | ORAL_CAPSULE | Freq: Three times a day (TID) | ORAL | 3 refills | Status: DC
Start: 2023-06-09 — End: 2023-12-01

## 2023-06-09 NOTE — Patient Instructions (Addendum)
 Stop wixella   Protonix  40mg   Take 30- 60 min before your first and last meals of the day   Pepcid  20mg  after supper and at bedtime   Finish prednisone and antibiotic  Gabapentin  300 mg three times daily   Neulizer albuterol / budesonide  1st thing in am and 12 hours later (plus if needed up to every 4 hours)  Cough > mucinex DM (or promethazine)  twice daily    GERD (REFLUX)  is an extremely common cause of respiratory symptoms just like yours , many times with no obvious heartburn at all.    It can be treated with medication, but also with lifestyle changes including elevation of the head of your bed (ideally with 6 -8inch blocks under the headboard of your bed),  Smoking cessation, avoidance of late meals, excessive alcohol, and avoid fatty foods, chocolate, peppermint, colas, red wine, and acidic juices such as orange juice.  NO MINT OR MENTHOL PRODUCTS SO NO COUGH DROPS - [employer]  USE SUGARLESS CANDY INSTEAD (Jolley ranchers or [employer] or Environmental manager) or even ice chips will also do - the key is to swallow to prevent all throat clearing. NO OIL BASED VITAMINS - use powdered substitutes.  Avoid fish oil when coughing.   Please schedule a follow up office visit in 4 weeks, sooner if needed  with all medications /inhalers/ solutions in hand so we can verify exactly what you are taking. This includes all medications from all doctors and over the counters

## 2023-06-09 NOTE — Assessment & Plan Note (Addendum)
 Onset in College with multiple allegies per Dr Jerelene Monday - D/c wixella 06/09/2023 >>>  - startee daily neb bud/ saba with max gerd rx 06/09/2023 >>>   Asthma vs Upper airway cough syndrome (previously labeled PNDS),  is so named because it's frequently impossible to sort out how much is  CR/sinusitis with freq throat clearing (which can be related to primary GERD)   vs  causing  secondary (" extra esophageal")  GERD from wide swings in gastric pressure that occur with throat clearing, often  promoting self use of mint and menthol lozenges that reduce the lower esophageal sphincter tone and exacerbate the problem further in a cyclical fashion.   These are the same pts (now being labeled as having "irritable larynx syndrome" by some cough centers) who not infrequently have a history of having failed to tolerate ace inhibitors,  dry powder inhalers or biphosphonates or report having atypical/extraesophageal reflux symptoms LPR that don't respond to standard doses of PPI  and are easily confused as having aecopd or asthma flares by even experienced allergists/ pulmonologists (myself included).    Of the three most common causes of  Sub-acute / recurrent or chronic cough, only one (GERD)  can actually contribute to/ trigger  the other two (asthma and post nasal drip syndrome)  and perpetuate the cylce of cough.  While not intuitively obvious, many patients with chronic low grade reflux do not cough until there is a primary insult that disturbs the protective epithelial barrier and exposes sensitive nerve endings.   This is typically viral but can due to PNDS and  either may apply here.   The point is that once this occurs, it is difficult to eliminate the cycle  using anything but a maximally effective acid suppression regimen at least in the short run, accompanied by an appropriate diet to address non acid GERD and control / eliminate the cough itself with gabapentin  300 mg tid and rx with asthma component with neb  pulm/bud with f/u in 4-6 weeks either in [place] or [place]  Discussed in detail all the  indications, usual  risks and alternatives  relative to the benefits with patient who agrees to proceed with Rx as outlined.             Each maintenance medication was reviewed in detail including emphasizing most importantly the difference between maintenance and prns and under what circumstances the prns are to be triggered using an action plan format where appropriate.  Total time for H and P, chart review, counseling, reviewing hfa/neb  device(s) and generating customized AVS unique to this office visit / same day charting = 62 min new pt eval

## 2023-06-14 DIAGNOSIS — M5416 Radiculopathy, lumbar region: Secondary | ICD-10-CM | POA: Diagnosis not present

## 2023-06-21 DIAGNOSIS — M5416 Radiculopathy, lumbar region: Secondary | ICD-10-CM | POA: Diagnosis not present

## 2023-06-21 NOTE — Telephone Encounter (Signed)
 [facility] has reached out to [place] several times.  She never responded to schedule an appointment so they closed her referral.

## 2023-06-29 ENCOUNTER — Ambulatory Visit: Admitting: Allergy and Immunology

## 2023-06-29 DIAGNOSIS — M5416 Radiculopathy, lumbar region: Secondary | ICD-10-CM | POA: Diagnosis not present

## 2023-07-03 ENCOUNTER — Ambulatory Visit
Admission: RE | Admit: 2023-07-03 | Discharge: 2023-07-03 | Disposition: A | Discharge status: Home / Self Care | Source: Ambulatory Visit | Attending: Neurosurgery | Admitting: Neurosurgery

## 2023-07-03 DIAGNOSIS — M48061 Spinal stenosis, lumbar region without neurogenic claudication: Secondary | ICD-10-CM | POA: Diagnosis not present

## 2023-07-03 DIAGNOSIS — M47816 Spondylosis without myelopathy or radiculopathy, lumbar region: Secondary | ICD-10-CM | POA: Diagnosis not present

## 2023-07-03 DIAGNOSIS — M5416 Radiculopathy, lumbar region: Secondary | ICD-10-CM

## 2023-07-03 MED ORDER — GADOPICLENOL 0.5 MMOL/ML IV SOLN
7.5000 mL | Freq: Once | INTRAVENOUS | Status: AC | PRN
  Administered 2023-07-03: 7.5 mL via INTRAVENOUS

## 2023-07-08 DIAGNOSIS — Z6828 Body mass index (BMI) 28.0-28.9, adult: Secondary | ICD-10-CM | POA: Diagnosis not present

## 2023-07-08 DIAGNOSIS — M5416 Radiculopathy, lumbar region: Secondary | ICD-10-CM | POA: Diagnosis not present

## 2023-07-12 NOTE — Progress Notes (Unsigned)
 Meredith Blackwell, female    DOB: [birth year]    MRN: [id number]   Brief patient profile:  78  yowf never smoker started coughing in college (? While on BCPs) and daily since then  eval by Kozlow mold cats rag weed  self -referred to pulmonary clinic in [facility]  06/09/2023 for daily cough.   History of Present Illness  06/09/2023  Pulmonary/ 1st office eval/ Meredith Blackwell / [place] [facility] wixella/ pred Chief Complaint  Patient presents with   Establish Care  Dyspnea:  cardio class , cough worse when stops  Cough: as soon as gets up out of bed in am min mucoid yellow thick  Sleep: bed is flat 2 pillows and does fine between flares most recent x 2 weeks  SABA use: air suprax  not using / nor neb equivalent  02: none ; Rec Stop wixella  Protonix  40mg   Take 30- 60 min before your first and last meals of the day  Pepcid  20mg  after supper and at bedtime  Finish prednisone and antibiotic Gabapentin  300 mg three times daily  Neulizer albuterol / budesonide  1st thing in am and 12 hours later (plus if needed up to every 4 hours) Cough > mucinex DM (or promethazine)  twice daily  GERD diet reviewed, bed blocks rec   Please schedule a follow up office visit in 4 weeks, sooner if needed  with all medications /inhalers/ solutions in hand    07/15/2023  f/u ov/[place] office/Silas Sedam re: cough since college  maint on alb/bud 0.5 bid did  bring meds  Chief Complaint  Patient presents with   Follow-up   Asthma  Dyspnea:  uphills then uses air supra at top and helps but makes her cough  Cough: resolved but still coughing some  after eating / hard rock candy helps  Sleeping: L side / bed is flat/ no resp cc  SABA use: only p exertion/ never pre or re challenges   02: none    No obvious day to day or daytime variability or assoc excess/ purulent sputum or mucus plugs or hemoptysis or cp or chest tightness, subjective wheeze or overt sinus or hb symptoms.    Also denies any obvious fluctuation of symptoms  with weather or environmental changes or other aggravating or alleviating factors except as outlined above   No unusual exposure hx or h/o childhood pna/ asthma or knowledge of premature birth.  Current Allergies, Complete Past Medical History, Past Surgical History, Family History, and Social History were reviewed in [employer] record.  ROS  The following are not active complaints unless bolded Hoarseness, sore throat, dysphagia, dental problems, itching, sneezing,  nasal congestion or discharge of excess mucus or purulent secretions, ear ache,   fever, chills, sweats, unintended wt loss or wt gain, classically pleuritic or exertional cp,  orthopnea pnd or arm/hand swelling  or leg swelling, presyncope, palpitations, abdominal pain, anorexia, nausea, vomiting, diarrhea  or change in bowel habits or change in bladder habits, change in stools or change in urine, dysuria, hematuria,  rash, arthralgias, visual complaints, headache, numbness, weakness or ataxia or problems with walking or coordination,  change in mood or  memory.        Current Meds  Medication Sig   albuterol  (PROVENTIL ) (2.5 MG/3ML) 0.083% nebulizer solution Take 3 mLs (2.5 mg total) by nebulization every 4 (four) hours as needed for wheezing or shortness of breath.   Albuterol -Budesonide  (AIRSUPRA ) 90-80 MCG/ACT AERO Inhale 2 Inhalations into the lungs every  4 (four) hours as needed.   budesonide  (PULMICORT ) 0.5 MG/2ML nebulizer solution Take 2 mLs (0.5 mg total) by nebulization 2 (two) times daily as needed.   Cholecalciferol 50 MCG (2000 UT) CAPS Take 2,000 Units by mouth daily.   Continuous Glucose Sensor (DEXCOM G7 SENSOR) MISC 1 each by Does not apply route once a week. Every ten days.   cyproheptadine  (PERIACTIN ) 4 MG tablet Take 0.5 tablets (2 mg total) by mouth at bedtime.   famotidine  (PEPCID ) 40 MG tablet Take 1 tablet (40 mg total) by mouth in the morning.   fexofenadine (ALLEGRA ODT) 30 MG  disintegrating tablet Take 180 mg by mouth daily.   fluticasone  (FLONASE) 50 MCG/ACT nasal spray Place into both nostrils daily.   gabapentin  (NEURONTIN ) 300 MG capsule Take 1 capsule (300 mg total) by mouth 3 (three) times daily.   ibuprofen (ADVIL) 200 MG tablet Take 200 mg by mouth as needed.   indomethacin  (INDOCIN ) 25 MG capsule Take 1 capsule (25 mg total) by mouth 3 (three) times daily as needed.   Melatonin 3 MG TABS Take by mouth.   ondansetron  (ZOFRAN -ODT) 4 MG disintegrating tablet DISSOLVE 1 TO 2 TABLETS ON THE TONGUE EVERY 8 HOURS AS NEEDED FOR NAUSEA. MAY ALSO TAKE WITH MAXALT  FOR MIGRAINE   pantoprazole  (PROTONIX ) 40 MG tablet Take 1 tablet (40 mg total) by mouth 2 (two) times daily.   predniSONE (DELTASONE) 10 MG tablet Take 10 mg by mouth.   promethazine-dextromethorphan (PROMETHAZINE-DM) 6.25-15 MG/5ML syrup Take by mouth.   QULIPTA  60 MG TABS TAKE 1 TABLET BY MOUTH EVERY DAY   raloxifene (EVISTA) 60 MG tablet raloxifene 60 mg tablet  TAKE 1 TABLET BY MOUTH ONCE DAILY   Rimegepant Sulfate (NURTEC) 75 MG TBDP TAKE 1 TABLET (75 MG) BY MOUTH DAILY AS NEEDED FOR MIGRAINES. TAKE AS CLOSE TO ONSET OF MIGRAINE AS POSSIBLE. ONE DAILY MAXIMUM.   rizatriptan  (MAXALT -MLT) 10 MG disintegrating tablet Take 1 tablet (10 mg total) by mouth as needed for migraine. May repeat in 2 hours if needed   vitamin C (ASCORBIC ACID) 500 MG tablet Take 500 mg by mouth daily.           Past Medical History:  Diagnosis Date   Abdominal pain    Anal spasm    Anemia    Asthma    BRCA gene mutation negative    Constipation    Diarrhea    Dysphagia    Environmental and seasonal allergies    Family history of bladder cancer    Family history of breast cancer    Family history of melanoma    Family history of ovarian cancer    GERD (gastroesophageal reflux disease)    Glaucoma    Hemorrhoid    Nausea       Objective:     Wt Readings from Last 3 Encounters:  07/15/23 180 lb (81.6 kg)   06/09/23 181 lb 12.8 oz (82.5 kg)  03/18/23 181 lb 3.5 oz (82.2 kg)      Vital signs reviewed  07/15/2023  - Note at rest 02 sats  96% on RA   General appearance:    pleasant amb wf harsh cough p using air supra o/w no cough at all during ov     HEENT : Oropharynx  clear      Nasal turbinates nl    NECK :  without  apparent JVD/ palpable Nodes/TM    LUNGS: no acc muscle use,  Nl contour  chest which is clear to A and P bilaterally without cough on insp or exp maneuvers   CV:  RRR  no s3 or murmur or increase in P2, and no edema   ABD:  soft and nontender   MS:  Gait nl   ext warm without deformities Or obvious joint restrictions  calf tenderness, cyanosis or clubbing    SKIN: warm and dry without lesions    NEURO:  alert, approp, nl sensorium with  no motor or cerebellar deficits apparent.     Assessment

## 2023-07-15 ENCOUNTER — Ambulatory Visit: Admitting: Internal Medicine

## 2023-07-15 ENCOUNTER — Encounter: Payer: Self-pay | Admitting: Internal Medicine

## 2023-07-15 VITALS — BP 114/80 | HR 89 | Ht 67.0 in | Wt 180.0 lb

## 2023-07-15 DIAGNOSIS — J45991 Cough variant asthma: Secondary | ICD-10-CM | POA: Diagnosis not present

## 2023-07-15 MED ORDER — FAMOTIDINE 20 MG PO TABS: ORAL_TABLET | ORAL | 11 refills | Status: DC

## 2023-07-15 NOTE — Assessment & Plan Note (Addendum)
 Onset in [employer] with multiple allegies per Dr Maurilio - D/c wixella 06/09/2023 >>>  - started  daily neb bud/ saba bid neb  with max gerd rx 06/09/2023 >>> improved 07/15/2023 on gabapentin  300 mg tid with rare perceived need for air supra prn    All goals of chronic asthma control met including optimal function and elimination of symptoms with minimal need for rescue therapy.  Contingencies discussed in full including contacting this office immediately if not controlling the symptoms using the rule of two's.     Cough controlled finally as well so no change rx  x   1) instructed on spacer to see if makes cough less with use of prn air supra  2) Re SABA :  I spent extra time with pt today reviewing appropriate use of albuterol  for prn use on exertion with the following points: 1) saba is for relief of sob that does not improve by walking a slower pace or resting but rather if the pt does not improve after trying this first. 2) If the pt is convinced, as many are, that saba helps recover from activity faster then it's easy to tell if this is the case by re-challenging : ie stop, take the inhaler, then p 5 minutes try the exact same activity (intensity of workload) that just caused the symptoms and see if they are substantially diminished or not after saba 3) if there is an activity that reproducibly causes the symptoms, try the saba 15 min before the activity on alternate days   If in fact the saba really does help, then fine to continue to use it prn but advised may need to look closer at the maintenance regimen being used to achieve better control of airways disease with exertion.   3) start to wean acid suppression by reducing pepcid  to to 20  mg p supper daily   F/u in 6 weeks with inhaler/spacer in hand          Each maintenance medication was reviewed in detail including emphasizing most importantly the difference between maintenance and prns and under what circumstances the prns are to  be triggered using an action plan format where appropriate.  Total time for H and P, chart review, counseling, reviewing hfa/spacer device(s) and generating customized AVS unique to this office visit / same day charting = 34 min

## 2023-07-15 NOTE — Patient Instructions (Addendum)
 No change in medications except :  Reduce pepcid  to take one after supper daily (either the 20 or the 40 is fine)   Also  Ok to try albuterol  15 min before an activity (on alternating days)  that you know would usually make you short of breath and see if it makes any difference and if makes none then don't take albuterol  after activity unless you can't catch your breath as this means it's the resting that helps, not the albuterol .  Late add: Use spacer for air supra to see if reduces coughing    Please schedule a follow up office visit in 6 weeks, call sooner if needed - bring inhalers / spacers

## 2023-07-17 ENCOUNTER — Other Ambulatory Visit: Payer: Self-pay | Admitting: Allergy and Immunology

## 2023-08-02 ENCOUNTER — Other Ambulatory Visit: Payer: Self-pay | Admitting: Family Medicine

## 2023-08-03 DIAGNOSIS — M5416 Radiculopathy, lumbar region: Secondary | ICD-10-CM | POA: Diagnosis not present

## 2023-08-17 ENCOUNTER — Encounter: Payer: Self-pay | Admitting: Allergy and Immunology

## 2023-08-17 ENCOUNTER — Other Ambulatory Visit: Payer: Self-pay

## 2023-08-17 ENCOUNTER — Ambulatory Visit (INDEPENDENT_AMBULATORY_CARE_PROVIDER_SITE_OTHER): Admitting: Allergy and Immunology

## 2023-08-17 VITALS — BP 122/80 | HR 78 | Temp 98.1°F | Resp 18 | Ht 66.34 in | Wt 180.6 lb

## 2023-08-17 DIAGNOSIS — J4540 Moderate persistent asthma, uncomplicated: Secondary | ICD-10-CM

## 2023-08-17 DIAGNOSIS — G43909 Migraine, unspecified, not intractable, without status migrainosus: Secondary | ICD-10-CM

## 2023-08-17 DIAGNOSIS — J301 Allergic rhinitis due to pollen: Secondary | ICD-10-CM

## 2023-08-17 DIAGNOSIS — K219 Gastro-esophageal reflux disease without esophagitis: Secondary | ICD-10-CM | POA: Diagnosis not present

## 2023-08-17 DIAGNOSIS — J3089 Other allergic rhinitis: Secondary | ICD-10-CM | POA: Diagnosis not present

## 2023-08-17 DIAGNOSIS — G4720 Circadian rhythm sleep disorder, unspecified type: Secondary | ICD-10-CM

## 2023-08-17 MED ORDER — FEXOFENADINE HCL 30 MG PO TBDP: 180.0000 mg | ORAL_TABLET | Freq: Every day | ORAL | 1 refills | Status: DC | PRN

## 2023-08-17 MED ORDER — FLUTICASONE PROPIONATE 50 MCG/ACT NA SUSP: 1.0000 | Freq: Two times a day (BID) | NASAL | 1 refills | Status: DC

## 2023-08-17 MED ORDER — PANTOPRAZOLE SODIUM 40 MG PO TBEC: 40.0000 mg | DELAYED_RELEASE_TABLET | Freq: Two times a day (BID) | ORAL | 1 refills | Status: DC

## 2023-08-17 MED ORDER — AIRSUPRA 90-80 MCG/ACT IN AERO: 2.0000 | INHALATION_SPRAY | RESPIRATORY_TRACT | 1 refills | Status: DC | PRN

## 2023-08-17 MED ORDER — FAMOTIDINE 20 MG PO TABS: 20.0000 mg | ORAL_TABLET | Freq: Two times a day (BID) | ORAL | 1 refills | Status: DC

## 2023-08-17 MED ORDER — NEBULIZER MASK ADULT MISC: 1.0000 | 1 refills | Status: AC

## 2023-08-17 MED ORDER — ALBUTEROL SULFATE (2.5 MG/3ML) 0.083% IN NEBU: 2.5000 mg | INHALATION_SOLUTION | RESPIRATORY_TRACT | 1 refills | Status: DC | PRN

## 2023-08-17 NOTE — Patient Instructions (Addendum)
  1. Treat and prevent inflammation of airway:   A. Flonase  - 1 spray each nostril 2 times per day  B. Nebulized budesonide  - 2 times per day    2. Treat and prevent reflux induced respiratory inflammation:   A. Minimize caffeine consumption  B. Replace throat clearing with swallowing/drinking maneuver  C. Continue pantoprazole  40 mg - 2 times per day  D. Continue famotidine  20 mg - 1 tablet @ 6pm and before bed  3. Treat and prevent headache / sleep dysfunction:   A. Minimize caffeine consumption  B. Periactin  4 mg - 1/2-1 tablet at bedtime  C. Discuss with Dr. Ines about starting Botox injections  4. If needed:   A. AirSupra  - 2 inhalations every 4-6 hours  B. Albuterol  added to budesonide  neb 2 times per day  B. Allegra  180 daily    5. Revisit with Dr. Darlean about gabapentin  use  6. Biologic agent for asthma???  7. Blood - area 2 aeroallergen profile  8. Follow up in 3 months (with me; not NP) or sooner if needed

## 2023-08-17 NOTE — Progress Notes (Unsigned)
 [place] [facility] [place] [facility] [place] - [place] - [place]   Follow-up Note  Referring Provider: Shayne Anes, MD Primary Provider: Shayne Anes, MD Date of Office Visit: 08/17/2023  Subjective:   Meredith Blackwell (DOB: August 27, 1965) is a 58 y.o. female [facility] in re-evaluation of the following:  HPI: Meredith Blackwell returns to this clinic in evaluation of asthma, allergic rhinitis, reflux, history of headache and sleep dysfunction.  I last saw her in this clinic 26 May 2022.  She was last seen in this clinic by our nurse practitioner on 25 March 2023.  Since I have seen her in this clinic last year she has apparently had several exacerbations of respiratory tract symptoms with cough and she has received systemic steroids on several occasions and ultimately ended up in the urgent care center and diagnosed with bronchitis in April 2025.SABRA  She then visited with Dr. Peg who removed her powdered inhaler and gave her nebulized budesonide  with albuterol  twice a day and that has definitely helped her significantly.  In addition she was started on gabapentin  and she is currently using 100 mg 3 times per day and she is addressing her reflux induced respiratory disease by continuing to use a proton pump inhibitor twice a day as well as 20 mg of famotidine  around 6 PM and 20 mg of famotidine  around 11 PM before bed.  On her current plan she rarely uses the short acting bronchodilator and she can exert herself without any problem.  She has very bad headaches.  She has Nurtec and Maxalt  to use as rescue while she continues on a daily preventative including Periactin  and Qulipta .  Her use of Nurtec and Maxalt  are probably 4 times a week.  She has an appointment to see her neurologist coming up this month.  Her sleep is actually going quite well while using Periactin  before bedtime.  Allergies as of 08/17/2023       Reactions   Doxycycline    Lansoprazole    Other  Reaction(s): bowels messed up; constipation; abdominal pain   Septra [sulfamethoxazole-trimethoprim]    Sulfamethoxazole    Trimethoprim         Medication List    Airsupra  90-80 MCG/ACT Aero Generic drug: Albuterol -Budesonide  INHALE 2 INHALATIONS INTO THE LUNGS EVERY 4 HOUR AS NEEDED.   albuterol  (2.5 MG/3ML) 0.083% nebulizer solution Commonly known as: PROVENTIL  USE 3 MLS (1 VIAL) BY NEBULIZATION EVERY 4 (FOUR) HOURS AS NEEDED FOR WHEEZING OR SHORTNESS OF BREATH.   ascorbic acid 500 MG tablet Commonly known as: VITAMIN C Take 500 mg by mouth daily.   budesonide  0.5 MG/2ML nebulizer solution Commonly known as: Pulmicort  Take 2 mLs (0.5 mg total) by nebulization 2 (two) times daily as needed.   Cholecalciferol 50 MCG (2000 UT) Caps Take 2,000 Units by mouth daily.   cyproheptadine  4 MG tablet Commonly known as: PERIACTIN  Take 0.5 tablets (2 mg total) by mouth at bedtime.   Dexcom G7 Sensor Misc 1 each by Does not apply route once a week. Every ten days.   famotidine  20 MG tablet Commonly known as: Pepcid  One after supper   fexofenadine  30 MG disintegrating tablet Commonly known as: ALLEGRA  ODT Take 180 mg by mouth daily.   fluticasone  50 MCG/ACT nasal spray Commonly known as: FLONASE  Place into both nostrils daily.   gabapentin  300 MG capsule Commonly known as: NEURONTIN  Take 1 capsule (300 mg total) by mouth 3 (three) times daily.  ibuprofen 200 MG tablet Commonly known as: ADVIL Take 200 mg by mouth as needed.   indomethacin  25 MG capsule Commonly known as: INDOCIN  Take 1 capsule (25 mg total) by mouth 3 (three) times daily as needed.   melatonin 3 MG Tabs tablet Take by mouth.   Nurtec 75 MG Tbdp Generic drug: Rimegepant Sulfate TAKE 1 TABLET (75 MG) BY MOUTH DAILY AS NEEDED FOR MIGRAINES. TAKE AS CLOSE TO ONSET OF MIGRAINE AS POSSIBLE. ONE DAILY MAXIMUM.   ondansetron  4 MG disintegrating tablet Commonly known as: ZOFRAN -ODT DISSOLVE 1 TO 2  TABLETS ON THE TONGUE EVERY 8 HOURS AS NEEDED FOR NAUSEA. MAY ALSO TAKE WITH MAXALT  FOR MIGRAINE   pantoprazole  40 MG tablet Commonly known as: PROTONIX  Take 1 tablet (40 mg total) by mouth 2 (two) times daily.   promethazine-dextromethorphan 6.25-15 MG/5ML syrup Commonly known as: PROMETHAZINE-DM Take by mouth.   Qulipta  60 MG Tabs Generic drug: Atogepant  TAKE 1 TABLET BY MOUTH EVERY DAY   raloxifene 60 MG tablet Commonly known as: EVISTA raloxifene 60 mg tablet  TAKE 1 TABLET BY MOUTH ONCE DAILY   rizatriptan  10 MG disintegrating tablet Commonly known as: MAXALT -MLT Take 1 tablet (10 mg total) by mouth as needed for migraine. May repeat in 2 hours if needed    Past Medical History:  Diagnosis Date  . Abdominal pain   . Anal spasm   . Anemia   . Asthma   . BRCA gene mutation negative   . Constipation   . Diarrhea   . Dysphagia   . Environmental and seasonal allergies   . Family history of bladder cancer   . Family history of breast cancer   . Family history of melanoma   . Family history of ovarian cancer   . GERD (gastroesophageal reflux disease)   . Glaucoma   . Hemorrhoid   . Nausea     Past Surgical History:  Procedure Laterality Date  . ABDOMINAL HYSTERECTOMY     pt says hysterectomy was vaginal  . APPENDECTOMY    . BACK SURGERY    . BREAST EXCISIONAL BIOPSY    . BREAST LUMPECTOMY    . NOSE SURGERY    . SHOULDER SURGERY    . TONSILLECTOMY    . WISDOM TOOTH EXTRACTION      Review of systems negative except as noted in HPI / PMHx or noted below:  Review of Systems  Constitutional: Negative.   HENT: Negative.    Eyes: Negative.   Respiratory: Negative.    Cardiovascular: Negative.   Gastrointestinal: Negative.   Genitourinary: Negative.   Musculoskeletal: Negative.   Skin: Negative.   Neurological: Negative.   Endo/Heme/Allergies: Negative.   Psychiatric/Behavioral: Negative.       Objective:   Vitals:   08/17/23 1619  BP: 122/80   Pulse: 78  Resp: 18  Temp: 98.1 F (36.7 C)  SpO2: 98%   Height: 5' 6.34 (168.5 cm)  Weight: 180 lb 9.6 oz (81.9 kg)   Physical Exam Constitutional:      Appearance: She is not diaphoretic.  HENT:     Head: Normocephalic.     Right Ear: Tympanic membrane, ear canal and external ear normal.     Left Ear: Tympanic membrane, ear canal and external ear normal.     Nose: Nose normal. No mucosal edema or rhinorrhea.     Mouth/Throat:     Pharynx: Uvula midline. No oropharyngeal exudate.  Eyes:     Conjunctiva/sclera: Conjunctivae normal.  Neck:     Thyroid: No thyromegaly.     Trachea: Trachea normal. No tracheal tenderness or tracheal deviation.  Cardiovascular:     Rate and Rhythm: Normal rate and regular rhythm.     Heart sounds: Normal heart sounds, S1 normal and S2 normal. No murmur heard. Pulmonary:     Effort: No respiratory distress.     Breath sounds: Normal breath sounds. No stridor. No wheezing or rales.  Lymphadenopathy:     Head:     Right side of head: No tonsillar adenopathy.     Left side of head: No tonsillar adenopathy.     Cervical: No cervical adenopathy.  Skin:    Findings: No erythema or rash.     Nails: There is no clubbing.  Neurological:     Mental Status: She is alert.     Diagnostics:    Spirometry was performed and demonstrated an FEV1 of 1.88 at 71 % of predicted. FEV1/FVC = 0.82.  Flow-volume loop with inspiratory blunting.  Assessment and Plan:   1. Not well controlled moderate persistent asthma   2. Perennial allergic rhinitis   3. Seasonal allergic rhinitis due to pollen   4. LPRD (laryngopharyngeal reflux disease)   5. Dysfunction of sleep stage or arousal   6. Migraine syndrome    1. Treat and prevent inflammation of airway:   A. Flonase  - 1 spray each nostril 2 times per day  B. Nebulized budesonide  - 2 times per day    2. Treat and prevent reflux induced respiratory inflammation:   A. Minimize caffeine consumption  B.  Replace throat clearing with swallowing/drinking maneuver  C. Continue pantoprazole  40 mg - 2 times per day  D. Continue famotidine  20 mg - 1 tablet @ 6pm and before bed  3. Treat and prevent headache / sleep dysfunction:   A. Minimize caffeine consumption  B. Periactin  4 mg - 1/2-1 tablet at bedtime  C. Discuss with Dr. Ines about starting Botox injections  4. If needed:   A. AirSupra  - 2 inhalations every 4-6 hours  B. Albuterol  added to budesonide  neb 2 times per day  B. Allegra  180 daily    5. Revisit with Dr. Darlean about gabapentin  use  6. Biologic agent for asthma???  7. Blood - area 2 aeroallergen profile  8. Follow up in 3 months (with me; not NP) or sooner if needed   Angeliz has had a lot going on since I have seen her in this clinic last year and she appears to have a combination of atopic respiratory disease which we will further define with an area to aeroallergen IgE profile and reflux induced respiratory disease and possibly a component of vocal cord dysfunction.    Camellia Denis, MD Allergy / Immunology [facility]

## 2023-08-18 ENCOUNTER — Encounter: Payer: Self-pay | Admitting: Allergy and Immunology

## 2023-08-19 DIAGNOSIS — D2262 Melanocytic nevi of left upper limb, including shoulder: Secondary | ICD-10-CM | POA: Diagnosis not present

## 2023-08-19 DIAGNOSIS — L814 Other melanin hyperpigmentation: Secondary | ICD-10-CM | POA: Diagnosis not present

## 2023-08-19 DIAGNOSIS — D2261 Melanocytic nevi of right upper limb, including shoulder: Secondary | ICD-10-CM | POA: Diagnosis not present

## 2023-08-19 DIAGNOSIS — D225 Melanocytic nevi of trunk: Secondary | ICD-10-CM | POA: Diagnosis not present

## 2023-08-20 LAB — ALLERGENS W/TOTAL IGE AREA 2
Alternaria Alternata IgE: <0.1 kU/L
Aspergillus Fumigatus IgE: <0.1 kU/L
Bermuda Grass IgE: <0.1 kU/L
Cat Dander IgE: 0.65 kU/L — AB
Cedar, Mountain IgE: 0.11 kU/L — AB
Cladosporium Herbarum IgE: <0.1 kU/L
Cockroach, German IgE: 0.15 kU/L — AB
Common Silver Birch IgE: <0.1 kU/L
Cottonwood IgE: <0.1 kU/L
D Farinae IgE: <0.1 kU/L
D Pteronyssinus IgE: <0.1 kU/L
Dog Dander IgE: 0.68 kU/L — AB
Elm, American IgE: <0.1 kU/L
IgE (Immunoglobulin E), Serum: 22 [IU]/mL (ref 6–495)
Johnson Grass IgE: <0.1 kU/L
Maple/Box Elder IgE: <0.1 kU/L
Mouse Urine IgE: <0.1 kU/L
Oak, White IgE: <0.1 kU/L
Pecan, Hickory IgE: <0.1 kU/L
Penicillium Chrysogen IgE: <0.1 kU/L
Pigweed, Rough IgE: <0.1 kU/L
Ragweed, Short IgE: <0.1 kU/L
Sheep Sorrel IgE Qn: <0.1 kU/L
Timothy Grass IgE: <0.1 kU/L
White Mulberry IgE: <0.1 kU/L

## 2023-08-23 ENCOUNTER — Ambulatory Visit: Payer: Self-pay | Admitting: Allergy and Immunology

## 2023-08-23 NOTE — Progress Notes (Signed)
 Meredith Blackwell, female    DOB: 1965-12-15    MRN: [id number]   Brief patient profile:  46  yowf never smoker started coughing in college (? While on BCPs) and daily since then  eval by Kozlow mold cats rag weed  self -referred to pulmonary clinic in [facility]  06/09/2023 for daily cough.   History of Present Illness  06/09/2023  Pulmonary/ 1st office eval/ Tyion Boylen / [place] [facility] wixella/ pred Chief Complaint  Patient presents with   Establish Care  Dyspnea:  cardio class , cough worse when stops  Cough: as soon as gets up out of bed in am min mucoid yellow thick  Sleep: bed is flat 2 pillows and does fine between flares most recent x 2 weeks  SABA use: air suprax  not using / nor neb equivalent  02: none ; Rec Stop wixella  Protonix  40mg   Take 30- 60 min before your first and last meals of the day  Pepcid  20mg  after supper and at bedtime  Finish prednisone and antibiotic Gabapentin  300 mg three times daily  Neulizer albuterol / budesonide  1st thing in am and 12 hours later (plus if needed up to every 4 hours) Cough > mucinex DM (or promethazine)  twice daily  GERD diet reviewed, bed blocks rec   Please schedule a follow up office visit in 4 weeks, sooner if needed  with all medications /inhalers/ solutions in hand    07/15/2023  f/u ov/[place] office/Chadley Dziedzic re: cough since college  maint on alb/bud 0.5 bid did  bring meds  Chief Complaint  Patient presents with   Follow-up   Asthma  Dyspnea:  uphills then uses air supra at top and helps but makes her cough  Cough: resolved but still coughing some  after eating / [place] candy helps  Sleeping: L side / bed is flat/ no resp cc  SABA use: only p exertion/ never pre or re challenges   02: none  Rec No change in medications except : Reduce pepcid  to take one after supper daily (either the 20 or the 40 is fine)  Also  Ok to try albuterol  15 min before an activity (on alternating days)  that you know would usually make you short  of breath  Late add: Use spacer for air supra to see if reduces coughing   Please schedule a follow up office visit in 6 weeks, call sooner if needed - bring inhalers / spacers     08/26/2023  f/u ov/[place] office/Johnathin Vanderschaaf re: cough since college maint on alb/bud bid    Chief Complaint  Patient presents with   Cough    Still experiencing some cough  Dyspnea:  stairs are fine -  not using air supra at all  Cough: on exp to smoke/ otherwise random, candy helps  Sleeping: flat bed/ 2 pillow s resp cc  SABA use: just bud/alb neb  Prednisone works best but none this summer, concerned about nasal drainage worse in fall ? Can she take allegra  bid (note at present has no nasal cc  day or noct)     No obvious day to day or daytime variability or assoc excess/ purulent sputum or mucus plugs or hemoptysis or cp or chest tightness, subjective wheeze or overt sinus or hb symptoms.    Also denies any obvious fluctuation of symptoms with weather or environmental changes or other aggravating or alleviating factors except as outlined above   No unusual exposure hx or h/o childhood pna/ asthma  or knowledge of premature birth.  Current Allergies, Complete Past Medical History, Past Surgical History, Family History, and Social History were reviewed in [employer] record.  ROS  The following are not active complaints unless bolded Hoarseness, sore throat, dysphagia, dental problems, itching, sneezing,  nasal congestion or discharge of excess mucus or purulent secretions, ear ache,   fever, chills, sweats, unintended wt loss or wt gain, classically pleuritic or exertional cp,  orthopnea pnd or arm/hand swelling  or leg swelling, presyncope, palpitations, abdominal pain, anorexia, nausea, vomiting, diarrhea  or change in bowel habits or change in bladder habits, change in stools or change in urine, dysuria, hematuria,  rash, arthralgias, visual complaints, headache, numbness, weakness or  ataxia or problems with walking or coordination,  change in mood or  memory.        Current Meds  Medication Sig   albuterol  (PROVENTIL ) (2.5 MG/3ML) 0.083% nebulizer solution Take 3 mLs (2.5 mg total) by nebulization every 4 (four) hours as needed for wheezing or shortness of breath.   Albuterol -Budesonide  (AIRSUPRA ) 90-80 MCG/ACT AERO Inhale 2 Inhalations into the lungs every 4 (four) hours as needed.   budesonide  (PULMICORT ) 0.5 MG/2ML nebulizer solution Take 2 mLs (0.5 mg total) by nebulization 2 (two) times daily as needed.   Cholecalciferol 50 MCG (2000 UT) CAPS Take 2,000 Units by mouth daily.   Continuous Glucose Sensor (DEXCOM G7 SENSOR) MISC 1 each by Does not apply route once a week. Every ten days.   cyproheptadine  (PERIACTIN ) 4 MG tablet Take 0.5 tablets (2 mg total) by mouth at bedtime.   famotidine  (PEPCID ) 20 MG tablet Take 1 tablet (20 mg total) by mouth 2 (two) times daily. Take one at 6pm and one before going to bed.   fexofenadine  (ALLEGRA  ODT) 30 MG disintegrating tablet Take 6 tablets (180 mg total) by mouth daily as needed (Can take an extra dose during flare ups.).   fluticasone  (FLONASE ) 50 MCG/ACT nasal spray Place 1 spray into both nostrils in the morning and at bedtime.   gabapentin  (NEURONTIN ) 300 MG capsule Take 1 capsule (300 mg total) by mouth 3 (three) times daily.   ibuprofen (ADVIL) 200 MG tablet Take 200 mg by mouth as needed.   indomethacin  (INDOCIN ) 25 MG capsule Take 1 capsule (25 mg total) by mouth 3 (three) times daily as needed.   Melatonin 3 MG TABS Take by mouth.   ondansetron  (ZOFRAN -ODT) 4 MG disintegrating tablet DISSOLVE 1 TO 2 TABLETS ON THE TONGUE EVERY 8 HOURS AS NEEDED FOR NAUSEA. MAY ALSO TAKE WITH MAXALT  FOR MIGRAINE   pantoprazole  (PROTONIX ) 40 MG tablet Take 1 tablet (40 mg total) by mouth 2 (two) times daily.   promethazine-dextromethorphan (PROMETHAZINE-DM) 6.25-15 MG/5ML syrup Take by mouth.   QULIPTA  60 MG TABS TAKE 1 TABLET BY MOUTH  EVERY DAY   raloxifene (EVISTA) 60 MG tablet raloxifene 60 mg tablet  TAKE 1 TABLET BY MOUTH ONCE DAILY   Respiratory Therapy Supplies (NEBULIZER MASK ADULT) MISC 1 kit by Does not apply route as directed.   Rimegepant Sulfate (NURTEC) 75 MG TBDP TAKE 1 TABLET (75 MG) BY MOUTH DAILY AS NEEDED FOR MIGRAINES. TAKE AS CLOSE TO ONSET OF MIGRAINE AS POSSIBLE. ONE DAILY MAXIMUM.   rizatriptan  (MAXALT -MLT) 10 MG disintegrating tablet Take 1 tablet (10 mg total) by mouth as needed for migraine. May repeat in 2 hours if needed   vitamin C (ASCORBIC ACID) 500 MG tablet Take 500 mg by mouth daily.  Past Medical History:  Diagnosis Date   Abdominal pain    Anal spasm    Anemia    Asthma    BRCA gene mutation negative    Constipation    Diarrhea    Dysphagia    Environmental and seasonal allergies    Family history of bladder cancer    Family history of breast cancer    Family history of melanoma    Family history of ovarian cancer    GERD (gastroesophageal reflux disease)    Glaucoma    Hemorrhoid    Nausea       Objective:    Wts  08/26/2023        179    07/15/23 180 lb (81.6 kg)  06/09/23 181 lb 12.8 oz (82.5 kg)  03/18/23 181 lb 3.5 oz (82.2 kg)    Vital signs reviewed  08/26/2023  - Note at rest 02 sats  100% on RA   General appearance:    pleasant healthy appearing wf nad   HEENT : Oropharynx  clear/ no thrush        NECK :  without  apparent JVD/ palpable Nodes/TM    LUNGS: no acc muscle use,  Nl contour chest which is clear to A and P bilaterally with  cough  on every  exp maneuvers   CV:  RRR  no s3 or murmur or increase in P2, and no edema   ABD:  soft and nontender   MS:  Gait nl   ext warm without deformities Or obvious joint restrictions  calf tenderness, cyanosis or clubbing    SKIN: warm and dry without lesions    NEURO:  alert, approp, nl sensorium with  no motor or cerebellar deficits apparent.      Assessment     Assessment &  Plan Cough variant asthma Onset in College with multiple allegies per Dr Maurilio - D/c wixella 06/09/2023 >>>  - started  daily neb bud/ saba bid neb  with max gerd rx 06/09/2023 >>> improved 07/15/2023 on gabapentin  300 mg tid with rare perceived need for air supra prn - Spacer instructions 07/15/2023 >>>  not using at all as of 08/26/2023 but not working out due to arthritis symptoms   Reproducible cough on FVC is typical of cough variant asthma and worse cough now occurs with passive exp to smoke so likely has irritant induced cough and needs to continue the gabapentin  for now at least until the cough is not present on expiration which may require biologics to eliminate and is supported by the hx that prednisone is the only thing that helps her  Rec: Continue AIRSUPRA  prn to supplement alb/bud 0.5 Proceed with biologics per Dr Maurilio Add 1st gen H1's at hs if feels needs more allergra than am dosing this fall  Pulmonary follow up can be prn for now   Discussed in detail all the  indications, usual  risks and alternatives  relative to the benefits with patient who agrees to proceed with Rx as outlined.       AVS instructions I agree with Dr Maurilio that allergy/asthma are driving your cough  Time the nebulizer with w/in 3 hours of exercise if possible  Also  Ok to try air supra with a spacer 15 min before an activity (on alternating days)  that you know would usually make you short of breath and see if it makes any difference and if makes none then don't take albuterol  after activity unless  you can't catch your breath as this means it's the resting that helps, not the albuterol .  For drainage / throat tickle try take CHLORPHENIRAMINE  4 mg  (Allergy Relief 4mg   at [facility] should be easiest to find in the blue box usually on bottom shelf)  take one every 4 hours as needed - extremely effective and inexpensive over the counter- may cause drowsiness so start with just a dose or two an hour  before bedtime and see how you tolerate it before trying in daytime.   Follow up here is as needed          Each maintenance medication was reviewed in detail including emphasizing most importantly the difference between maintenance and prns and under what circumstances the prns are to be triggered using an action plan format where appropriate.  Total time for H and P, chart review, counseling, reviewing hfa/ neb/ spacer device(s) and generating customized AVS unique to this office visit / same day charting = 34 min final summary f/u pulmonary ov         Ozell America, MD 08/26/2023

## 2023-08-24 DIAGNOSIS — M5416 Radiculopathy, lumbar region: Secondary | ICD-10-CM | POA: Diagnosis not present

## 2023-08-24 DIAGNOSIS — Z6827 Body mass index (BMI) 27.0-27.9, adult: Secondary | ICD-10-CM | POA: Diagnosis not present

## 2023-08-26 ENCOUNTER — Encounter: Payer: Self-pay | Admitting: Allergy and Immunology

## 2023-08-26 ENCOUNTER — Ambulatory Visit (INDEPENDENT_AMBULATORY_CARE_PROVIDER_SITE_OTHER): Admitting: Internal Medicine

## 2023-08-26 ENCOUNTER — Encounter: Payer: Self-pay | Admitting: Internal Medicine

## 2023-08-26 VITALS — BP 129/79 | HR 72 | Ht 66.0 in | Wt 179.8 lb

## 2023-08-26 DIAGNOSIS — J45991 Cough variant asthma: Secondary | ICD-10-CM

## 2023-08-26 NOTE — Assessment & Plan Note (Addendum)
 Onset in College with multiple allegies per Dr Maurilio - D/c wixella 06/09/2023 >>>  - started  daily neb bud/ saba bid neb  with max gerd rx 06/09/2023 >>> improved 07/15/2023 on gabapentin  300 mg tid with rare perceived need for air supra prn - Spacer instructions 07/15/2023 >>>  not using at all as of 08/26/2023 but not working out due to arthritis symptoms   Reproducible cough on FVC is typical of cough variant asthma and worse cough now occurs with passive exp to smoke so likely has irritant induced cough and needs to continue the gabapentin  for now at least until the cough is not present on expiration which may require biologics to eliminate and is supported by the hx that prednisone is the only thing that helps her  Rec: Continue AIRSUPRA  prn to supplement alb/bud 0.5 Proceed with biologics per Dr Maurilio Add 1st gen H1's at hs if feels needs more allergra than am dosing this fall  Pulmonary follow up can be prn for now   Discussed in detail all the  indications, usual  risks and alternatives  relative to the benefits with patient who agrees to proceed with Rx as outlined.

## 2023-08-26 NOTE — Patient Instructions (Addendum)
 I agree with Dr Maurilio that allergy/asthma are driving your cough  Time the nebulizer with w/in 3 hours of exercise if possible  Also  Ok to try air supra with a spacer 15 min before an activity (on alternating days)  that you know would usually make you short of breath and see if it makes any difference and if makes none then don't take albuterol  after activity unless you can't catch your breath as this means it's the resting that helps, not the albuterol .  For drainage / throat tickle try take CHLORPHENIRAMINE  4 mg  (Allergy Relief 4mg   at [facility] should be easiest to find in the blue box usually on bottom shelf)  take one every 4 hours as needed - extremely effective and inexpensive over the counter- may cause drowsiness so start with just a dose or two an hour before bedtime and see how you tolerate it before trying in daytime.   Follow up here is as needed

## 2023-08-30 ENCOUNTER — Encounter: Payer: Self-pay | Admitting: Family Medicine

## 2023-08-31 ENCOUNTER — Telehealth: Payer: Self-pay | Admitting: *Deleted

## 2023-08-31 DIAGNOSIS — G43009 Migraine without aura, not intractable, without status migrainosus: Secondary | ICD-10-CM

## 2023-08-31 NOTE — Telephone Encounter (Signed)
 Spoke to patient and have her PBM will get auth and reach out to patient with next steps for same

## 2023-08-31 NOTE — Telephone Encounter (Signed)
 Needs Botox auth. New start.   Chronic Migraine CPT 64615    Botox J0585 Units:200   G43.711 Chronic migraine without aura, with intractable migraine, so stated, with status migrainosus

## 2023-08-31 NOTE — Telephone Encounter (Signed)
 Sent to Jillian to work on Engineer, maintenance (IT) for Botox. See phone note 08/31/23

## 2023-09-01 NOTE — Telephone Encounter (Signed)
 FYI

## 2023-09-01 NOTE — Telephone Encounter (Signed)
 Submitted auth request via CMM, status is pending. Key: [id number]

## 2023-09-01 NOTE — Telephone Encounter (Signed)
 Pending case #: [id number]

## 2023-09-01 NOTE — Telephone Encounter (Signed)
 Spoke to patient and advised approval, copay card and submit to [employer]. Instructed on delivery, storage, dosing and initial injection in clinic

## 2023-09-02 ENCOUNTER — Other Ambulatory Visit: Payer: Self-pay

## 2023-09-02 MED ORDER — EPINEPHRINE 0.3 MG/0.3ML IJ SOAJ: 0.3000 mg | INTRAMUSCULAR | 1 refills | Status: AC | PRN

## 2023-09-02 NOTE — Telephone Encounter (Addendum)
 [facility] handles Botox, her medical BCBS will not cover. BCBS does cover [phone] with no PA required. I faxed PA forms to [facility] @ [phone].

## 2023-09-03 NOTE — Telephone Encounter (Signed)
 Auth was approved, please send rx to [facility].  Auth#: [id number] (09/02/23-03/04/24)

## 2023-09-06 ENCOUNTER — Other Ambulatory Visit (HOSPITAL_COMMUNITY): Payer: Self-pay

## 2023-09-06 ENCOUNTER — Other Ambulatory Visit: Payer: Self-pay | Admitting: Pharmacy Technician

## 2023-09-06 ENCOUNTER — Telehealth: Payer: Self-pay

## 2023-09-06 ENCOUNTER — Other Ambulatory Visit: Payer: Self-pay

## 2023-09-06 MED ORDER — ONABOTULINUMTOXINA 200 UNITS IJ SOLR
INTRAMUSCULAR | 2 refills | Status: AC
  Filled 2023-09-06: qty 1, fill #0
  Filled 2023-09-08: qty 1, 84d supply, fill #0
  Filled 2023-12-20: qty 1, 84d supply, fill #1

## 2023-09-06 NOTE — Telephone Encounter (Signed)
 Rx sent in

## 2023-09-06 NOTE — Addendum Note (Signed)
 Addended by: JOSHUA MAURILIO CROME on: 09/06/2023 07:55 AM   Modules accepted: Orders

## 2023-09-06 NOTE — Telephone Encounter (Signed)
 Patient is scheduled for 09/23/23, informed her that [facility] would be reaching out and informed Odella of appt date.

## 2023-09-06 NOTE — Telephone Encounter (Signed)
 ERROR

## 2023-09-07 ENCOUNTER — Other Ambulatory Visit: Payer: Self-pay

## 2023-09-08 ENCOUNTER — Other Ambulatory Visit (HOSPITAL_COMMUNITY): Payer: Self-pay

## 2023-09-08 ENCOUNTER — Other Ambulatory Visit: Payer: Self-pay | Admitting: Allergy and Immunology

## 2023-09-08 ENCOUNTER — Other Ambulatory Visit: Payer: Self-pay

## 2023-09-08 DIAGNOSIS — J3089 Other allergic rhinitis: Secondary | ICD-10-CM

## 2023-09-08 DIAGNOSIS — J301 Allergic rhinitis due to pollen: Secondary | ICD-10-CM

## 2023-09-08 NOTE — Progress Notes (Unsigned)
 Specialty Pharmacy Initial Fill Coordination Note  Meredith Blackwell is a 58 y.o. female contacted today regarding initial fill of specialty medication(s) OnabotulinumtoxinA  (BOTOX )   Patient requested Courier to Provider Office   Delivery date: 09/16/23   Verified address: [facility], [address], [place], [place] [address]   Medication will be filled on 09/15/2023.   Patient is aware of 0 copayment.

## 2023-09-08 NOTE — Progress Notes (Signed)
 Aeroallergen Immunotherapy  Ordering Provider: Dr. Camellia Denis  Patient Details Name: Meredith Blackwell MRN: [id number] Date of Birth: November 17, 1965  Order one of two  Vial Label: cat, dog  0.5 ml (Volume)  1:10 Concentration -- Cat Hair 0.5 ml (Volume)  1:10 Concentration -- Dog Epithelia   1.0  ml Extract Subtotal 4.0  ml Diluent  5.0  ml Maintenance Total  Blue Vial (1:100,000): Schedule B (6 doses) Yellow Vial (1:10,000): Schedule B (6 doses) Green Vial (1:1,000): Schedule B (6 doses) Red Vial (1:100): Schedule A (12 doses)  Special Instructions: 1-2 times per week

## 2023-09-08 NOTE — Progress Notes (Signed)
 VIALS MADE 09-08-23

## 2023-09-08 NOTE — Progress Notes (Signed)
 Aeroallergen Immunotherapy  Ordering Provider: Dr. Camellia Denis  Patient Details Name: Meredith Blackwell MRN: [id number] Date of Birth: 1965-11-30  Order two of two  Vial Label: tree  0.2 ml (Volume)  1:10 Concentration -- Cedar, red   0.2  ml Extract Subtotal 4.8  ml Diluent  5.0  ml Maintenance Total  Blue Vial (1:100,000): Schedule B (6 doses) Yellow Vial (1:10,000): Schedule B (6 doses) Green Vial (1:1,000): Schedule B (6 doses) Red Vial (1:100): Schedule A (12 doses)  Special Instructions: 1-2 times per week

## 2023-09-09 DIAGNOSIS — J3081 Allergic rhinitis due to animal (cat) (dog) hair and dander: Secondary | ICD-10-CM | POA: Diagnosis not present

## 2023-09-17 ENCOUNTER — Ambulatory Visit (INDEPENDENT_AMBULATORY_CARE_PROVIDER_SITE_OTHER)

## 2023-09-17 DIAGNOSIS — J4550 Severe persistent asthma, uncomplicated: Secondary | ICD-10-CM

## 2023-09-17 MED ORDER — TEZEPELUMAB-EKKO 210 MG/1.91ML ~~LOC~~ SOSY
210.0000 mg | PREFILLED_SYRINGE | SUBCUTANEOUS | Status: AC
  Administered 2023-09-17 – 2024-02-10 (×6): 210 mg via SUBCUTANEOUS

## 2023-09-17 NOTE — Progress Notes (Signed)
 Immunotherapy   Patient Details  Name: Meredith Blackwell MRN: [id number] Date of Birth: 1965/01/28  09/17/2023  Meredith Blackwell Ned started injections for  [place]   Frequency: Every 28 days Epi-Pen:Epi-Pen Available  Consent signed and patient instructions given. Patient received injection in her left arm and waited in office 15 minutes with no issues.   Meredith Blackwell Shed 09/17/2023, 3:31 PM

## 2023-09-20 ENCOUNTER — Encounter: Payer: Self-pay | Admitting: Allergy and Immunology

## 2023-09-21 ENCOUNTER — Ambulatory Visit (INDEPENDENT_AMBULATORY_CARE_PROVIDER_SITE_OTHER)

## 2023-09-21 DIAGNOSIS — Z01419 Encounter for gynecological examination (general) (routine) without abnormal findings: Secondary | ICD-10-CM | POA: Diagnosis not present

## 2023-09-21 DIAGNOSIS — J309 Allergic rhinitis, unspecified: Secondary | ICD-10-CM | POA: Diagnosis not present

## 2023-09-21 DIAGNOSIS — Z6828 Body mass index (BMI) 28.0-28.9, adult: Secondary | ICD-10-CM | POA: Diagnosis not present

## 2023-09-21 NOTE — Progress Notes (Signed)
 Immunotherapy   Patient Details  Name: KEYETTA HOLLINGWORTH MRN: [id number] Date of Birth: 1965/04/07  09/21/2023  Zada CHRISTELLA Ned started injections for  Cat-Dog & Tree Following schedule: B  Frequency: Twice Weekly Epi-Pen:Epi-Pen Available  Consent signed and patient instructions given. Patient waited in office for 30 minutes with no issues.    Isaiah LITTIE Shed 09/21/2023, 2:37 PM

## 2023-09-22 NOTE — Progress Notes (Unsigned)
 09/23/23 ALL: Meredith Blackwell presents to start Botox . Now having at least 15 headache days with most all being described as migrainous. She continues Qulipta  and rizatriptan .     Consent Form Botulism Toxin Injection For Chronic Migraine    Reviewed orally with patient, additionally signature is on file:  Botulism toxin has been approved by the Federal drug administration for treatment of chronic migraine. Botulism toxin does not cure chronic migraine and it may not be effective in some patients.  The administration of botulism toxin is accomplished by injecting a small amount of toxin into the muscles of the neck and head. Dosage must be titrated for each individual. Any benefits resulting from botulism toxin tend to wear off after 3 months with a repeat injection required if benefit is to be maintained. Injections are usually done every 3-4 months with maximum effect peak achieved by about 2 or 3 weeks. Botulism toxin is expensive and you should be sure of what costs you will incur resulting from the injection.  The side effects of botulism toxin use for chronic migraine may include:   -Transient, and usually mild, facial weakness with facial injections  -Transient, and usually mild, head or neck weakness with head/neck injections  -Reduction or loss of forehead facial animation due to forehead muscle weakness  -Eyelid drooping  -Dry eye  -Pain at the site of injection or bruising at the site of injection  -Double vision  -Potential unknown long term risks   Contraindications: You should not have Botox  if you are pregnant, nursing, allergic to albumin, have an infection, skin condition, or muscle weakness at the site of the injection, or have myasthenia gravis, Lambert-Eaton syndrome, or ALS.  It is also possible that as with any injection, there may be an allergic reaction or no effect from the medication. Reduced effectiveness after repeated injections is sometimes seen and rarely  infection at the injection site may occur. All care will be taken to prevent these side effects. If therapy is given over a long time, atrophy and wasting in the muscle injected may occur. Occasionally the patient's become refractory to treatment because they develop antibodies to the toxin. In this event, therapy needs to be modified.  I have read the above information and consent to the administration of botulism toxin.    BOTOX  PROCEDURE NOTE FOR MIGRAINE HEADACHE  Contraindications and precautions discussed with patient(above). Aseptic procedure was observed and patient tolerated procedure. Procedure performed by Greig Forbes, FNP-C.   The condition has existed for more than 6 months, and pt does not have a diagnosis of ALS, Myasthenia Gravis or Lambert-Eaton Syndrome.  Risks and benefits of injections discussed and pt agrees to proceed with the procedure.  Written consent obtained  These injections are medically necessary. Pt  receives good benefits from these injections. These injections do not cause sedations or hallucinations which the oral therapies may cause.   Description of procedure:  The patient was placed in a sitting position. The standard protocol was used for Botox  as follows, with 5 units of Botox  injected at each site:  -Procerus muscle, midline injection  -Corrugator muscle, bilateral injection  -Frontalis muscle, bilateral injection, with 2 sites each side, medial injection was performed in the upper one third of the frontalis muscle, in the region vertical from the medial inferior edge of the superior orbital rim. The lateral injection was again in the upper one third of the forehead vertically above the lateral limbus of the cornea, 1.5 cm lateral  to the medial injection site.  -Temporalis muscle injection, 4 sites, bilaterally. The first injection was 3 cm above the tragus of the ear, second injection site was 1.5 cm to 3 cm up from the first injection site in line with  the tragus of the ear. The third injection site was 1.5-3 cm forward between the first 2 injection sites. The fourth injection site was 1.5 cm posterior to the second injection site. 5th site laterally in the temporalis  muscleat the level of the outer canthus.  -Occipitalis muscle injection, 3 sites, bilaterally. The first injection was done one half way between the occipital protuberance and the tip of the mastoid process behind the ear. The second injection site was done lateral and superior to the first, 1 fingerbreadth from the first injection. The third injection site was 1 fingerbreadth superiorly and medially from the first injection site.  -Cervical paraspinal muscle injection, 2 sites, bilaterally. The first injection site was 1 cm from the midline of the cervical spine, 3 cm inferior to the lower border of the occipital protuberance. The second injection site was 1.5 cm superiorly and laterally to the first injection site.  -Trapezius muscle injection was performed at 3 sites, bilaterally. The first injection site was in the upper trapezius muscle halfway between the inflection point of the neck, and the acromion. The second injection site was one half way between the acromion and the first injection site. The third injection was done between the first injection site and the inflection point of the neck.   Will return for repeat injection in 3 months.   A total of 200 units of Botox  was prepared, 155 units of Botox  was injected as documented above, any Botox  not injected was wasted. The patient tolerated the procedure well, there were no complications of the above procedure.

## 2023-09-23 ENCOUNTER — Encounter: Payer: Self-pay | Admitting: Family Medicine

## 2023-09-23 ENCOUNTER — Ambulatory Visit (INDEPENDENT_AMBULATORY_CARE_PROVIDER_SITE_OTHER): Admitting: Family Medicine

## 2023-09-23 VITALS — BP 125/74 | HR 72

## 2023-09-23 DIAGNOSIS — G43709 Chronic migraine without aura, not intractable, without status migrainosus: Secondary | ICD-10-CM | POA: Diagnosis not present

## 2023-09-23 DIAGNOSIS — G43009 Migraine without aura, not intractable, without status migrainosus: Secondary | ICD-10-CM

## 2023-09-23 MED ORDER — ONABOTULINUMTOXINA 200 UNITS IJ SOLR
155.0000 [IU] | Freq: Once | INTRAMUSCULAR | Status: AC
  Administered 2023-09-23: 155 [IU] via INTRAMUSCULAR

## 2023-09-23 NOTE — Progress Notes (Signed)
 Botox - 200 units x 1 vial Lot: [id number] Expiration: 11/2024 NDC: 9976-6078-97  Bacteriostatic 0.9% Sodium Chloride - 30 mL  Lot: [id number] Expiration: OCT-31-2026 NDC: 9590-8033-97  Dx:G43.009 S/P Witnessed ab:Jempo Joshua, RN

## 2023-09-27 ENCOUNTER — Ambulatory Visit (INDEPENDENT_AMBULATORY_CARE_PROVIDER_SITE_OTHER)

## 2023-09-27 DIAGNOSIS — J309 Allergic rhinitis, unspecified: Secondary | ICD-10-CM

## 2023-09-29 ENCOUNTER — Other Ambulatory Visit: Payer: Self-pay | Admitting: Family Medicine
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
## 2023-09-30 NOTE — Patient Instructions (Signed)
 Below is our plan:  We will continue Botox , Qulipta , Nurtec and rizatriptan  as prescribed. Let me know if you need me!  Please make sure you are staying well hydrated. I recommend 50-60 ounces daily. Well balanced diet and regular exercise encouraged. Consistent sleep schedule with 6-8 hours recommended.   Please continue follow up with care team as directed.   Follow up with me in 1 year   You may receive a survey regarding today's visit. I encourage you to leave honest feed back as I do use this information to improve patient care. Thank you for seeing me today!   GENERAL HEADACHE INFORMATION:   Natural supplements: Magnesium Oxide or Magnesium Glycinate 500 mg at bed (up to 800 mg daily) Coenzyme Q10 300 mg in AM Vitamin B2- 200 mg twice a day   Add 1 supplement at a time since even natural supplements can have undesirable side effects. You can sometimes buy supplements cheaper (especially Coenzyme Q10) at www.WebmailGuide.co.za or at [facility]  Migraine with aura: There is increased risk for stroke in women with migraine with aura and a contraindication for the combined contraceptive pill for use by women who have migraine with aura. The risk for women with migraine without aura is lower. However other risk factors like smoking are far more likely to increase stroke risk than migraine. There is a recommendation for no smoking and for the use of OCPs without estrogen such as progestogen only pills particularly for women with migraine with aura.SABRA People who have migraine headaches with auras may be 3 times more likely to have a stroke caused by a blood clot, compared to migraine patients who don't see auras. Women who take hormone-replacement therapy may be 30 percent more likely to suffer a clot-based stroke than women not taking medication containing estrogen. Other risk factors like smoking and high blood pressure may be  much more important.    Vitamins and herbs that show potential:    Magnesium: Magnesium (250 mg twice a day or 500 mg at bed) has a relaxant effect on smooth muscles such as blood vessels. Individuals suffering from frequent or daily headache usually have low magnesium levels which can be increase with daily supplementation of 400-750 mg. Three trials found 40-90% average headache reduction  when used as a preventative. Magnesium may help with headaches are aura, the best evidence for magnesium is for migraine with aura is its thought to stop the cortical spreading depression we believe is the pathophysiology of migraine aura.Magnesium also demonstrated the benefit in menstrually related migraine.  Magnesium is part of the messenger system in the serotonin cascade and it is a good muscle relaxant.  It is also useful for constipation which can be a side effect of other medications used to treat migraine. Good sources include nuts, whole grains, and tomatoes. Side Effects: loose stool/diarrhea  Riboflavin (vitamin B 2) 200 mg twice a day. This vitamin assists nerve cells in the production of ATP a principal energy storing molecule.  It is necessary for many chemical reactions in the body.  There have been at least 3 clinical trials of riboflavin using 400 mg per day all of which suggested that migraine frequency can be decreased.  All 3 trials showed significant improvement in over half of migraine sufferers.  The supplement is found in bread, cereal, milk, meat, and poultry.  Most Americans get more riboflavin than the recommended daily allowance, however riboflavin deficiency is not necessary for the supplements to help prevent headache. Side  effects: energizing, green urine   Coenzyme Q10: This is present in almost all cells in the body and is critical component for the conversion of energy.  Recent studies have shown that a nutritional supplement of CoQ10 can reduce the frequency of migraine attacks by improving the energy production of cells as with riboflavin.  Doses of  150 mg twice a day have been shown to be effective.   Melatonin: Increasing evidence shows correlation between melatonin secretion and headache conditions.  Melatonin supplementation has decreased headache intensity and duration.  It is widely used as a sleep aid.  Sleep is natures way of dealing with migraine.  A dose of 3 mg is recommended to start for headaches including cluster headache. Higher doses up to 15 mg has been reviewed for use in Cluster headache and have been used. The rationale behind using melatonin for cluster is that many theories regarding the cause of Cluster headache center around the disruption of the normal circadian rhythm in the brain.  This helps restore the normal circadian rhythm.   HEADACHE DIET: Foods and beverages which may trigger migraine Note that only 20% of headache patients are food sensitive. You will know if you are food sensitive if you get a headache consistently 20 minutes to 2 hours after eating a certain food. Only cut out a food if it causes headaches, otherwise you might remove foods you enjoy! What matters most for diet is to eat a well balanced healthy diet full of vegetables and low fat protein, and to not miss meals.   Chocolate, other sweets ALL cheeses except cottage and cream cheese Dairy products, yogurt, sour cream, ice cream Liver Meat extracts (Bovril, Marmite, meat tenderizers) Meats or fish which have undergone aging, fermenting, pickling or smoking. These include: Hotdogs,salami,Lox,sausage, mortadellas,smoked salmon, pepperoni, Pickled herring Pods of broad bean (English beans, Chinese pea pods, [place] (fava) beans, lima and navy beans Ripe avocado, ripe banana Yeast extracts or active yeast preparations such as Brewer's or Fleishman's (commercial bakes goods are permitted) Tomato based foods, pizza (lasagna, etc.)   MSG (monosodium glutamate) is disguised as many things; look for these common aliases: Monopotassium  glutamate Autolysed yeast Hydrolysed protein Sodium caseinate "flavorings" "all natural preservatives Nutrasweet   Avoid all other foods that convincingly provoke headaches.   Resources: The Dizzy Bluford Aid Your Headache Diet, migrainestrong.com  https://zamora-andrews.com/   Caffeine and Migraine For patients that have migraine, caffeine intake more than 3 days per week can lead to dependency and increased migraine frequency. I would recommend cutting back on your caffeine intake as best you can. The recommended amount of caffeine is 200-300 mg daily, although migraine patients may experience dependency at even lower doses. While you may notice an increase in headache temporarily, cutting back will be helpful for headaches in the long run. For more information on caffeine and migraine, visit: https://americanmigrainefoundation.org/resource-library/caffeine-and-migraine/   Headache Prevention Strategies:   1. Maintain a headache diary; learn to identify and avoid triggers.  - This can be a simple note where you log when you had a headache, associated symptoms, and medications used - There are several smartphone apps developed to help track migraines: Migraine Buddy, Migraine Monitor, Curelator N1-Headache App   Common triggers include: Emotional triggers: Emotional/Upset family or friends Emotional/Upset occupation Business reversal/success Anticipation anxiety Crisis-serious Post-crisis periodNew job/position   Physical triggers: Vacation Day Weekend Strenuous Exercise High Altitude Location New Move Menstrual Day Physical Illness Oversleep/Not enough sleep Weather changes Light: Photophobia or light sesnitivity treatment involves  a balance between desensitization and reduction in overly strong input. Use dark polarized glasses outside, but not inside. Avoid bright or fluorescent light, but do not dim environment to the point  that going into a normally lit room hurts. Consider FL-41 tint lenses, which reduce the most irritating wavelengths without blocking too much light.  These can be obtained at axonoptics.com or theraspecs.com Foods: see list above.   2. Limit use of acute treatments (over-the-counter medications, triptans, etc.) to no more than 2 days per week or 10 days per month to prevent medication overuse headache (rebound headache).     3. Follow a regular schedule (including weekends and holidays): Don't skip meals. Eat a balanced diet. 8 hours of sleep nightly. Minimize stress. Exercise 30 minutes per day. Being overweight is associated with a 5 times increased risk of chronic migraine. Keep well hydrated and drink 6-8 glasses of water per day.   4. Initiate non-pharmacologic measures at the earliest onset of your headache. Rest and quiet environment. Relax and reduce stress. Breathe2Relax is a free app that can instruct you on    some simple relaxtion and breathing techniques. Http://Dawnbuse.com is a    free website that provides teaching videos on relaxation.  Also, there are  many apps that   can be downloaded for "mindful" relaxation.  An app called YOGA NIDRA will help walk you through mindfulness. Another app called Calm can be downloaded to give you a structured mindfulness guide with daily reminders and skill development. Headspace for guided meditation Mindfulness Based Stress Reduction Online Course: www.palousemindfulness.com Cold compresses.   5. Don't wait!! Take the maximum allowable dosage of prescribed medication at the first sign of migraine.   6. Compliance:  Take prescribed medication regularly as directed and at the first sign of a migraine.   7. Communicate:  Call your physician when problems arise, especially if your headaches change, increase in frequency/severity, or become associated with neurological symptoms (weakness, numbness, slurred speech, etc.). Proceed to emergency room  if you experience new or worsening symptoms or symptoms do not resolve, if you have new neurologic symptoms or if headache is severe, or for any concerning symptom.   8. Headache/pain management therapies: Consider various complementary methods, including medication, behavioral therapy, psychological counselling, biofeedback, massage therapy, acupuncture, dry needling, and other modalities.  Such measures may reduce the need for medications. Counseling for pain management, where patients learn to function and ignore/minimize their pain, seems to work very well.   9. Recommend changing family's attention and focus away from patient's headaches. Instead, emphasize daily activities. If first question of day is 'How are your headaches/Do you have a headache today?', then patient will constantly think about headaches, thus making them worse. Goal is to re-direct attention away from headaches, toward daily activities and other distractions.   10. Helpful Websites: www.AmericanHeadacheSociety.org PatentHood.ch www.headaches.org TightMarket.nl www.achenet.org

## 2023-10-01 ENCOUNTER — Other Ambulatory Visit: Payer: Self-pay

## 2023-10-04 ENCOUNTER — Ambulatory Visit

## 2023-10-04 ENCOUNTER — Ambulatory Visit (INDEPENDENT_AMBULATORY_CARE_PROVIDER_SITE_OTHER): Admitting: Family Medicine

## 2023-10-04 ENCOUNTER — Encounter: Payer: Self-pay | Admitting: Family Medicine

## 2023-10-04 ENCOUNTER — Other Ambulatory Visit: Payer: Self-pay | Admitting: Internal Medicine

## 2023-10-04 VITALS — BP 138/81 | HR 77 | Ht 66.5 in | Wt 181.6 lb

## 2023-10-04 DIAGNOSIS — G43711 Chronic migraine without aura, intractable, with status migrainosus: Secondary | ICD-10-CM

## 2023-10-04 DIAGNOSIS — G43009 Migraine without aura, not intractable, without status migrainosus: Secondary | ICD-10-CM | POA: Diagnosis not present

## 2023-10-04 DIAGNOSIS — M5416 Radiculopathy, lumbar region: Secondary | ICD-10-CM

## 2023-10-04 DIAGNOSIS — N63 Unspecified lump in unspecified breast: Secondary | ICD-10-CM

## 2023-10-04 DIAGNOSIS — J309 Allergic rhinitis, unspecified: Secondary | ICD-10-CM

## 2023-10-04 MED ORDER — RIZATRIPTAN BENZOATE 10 MG PO TBDP: 10.0000 mg | ORAL_TABLET | ORAL | 11 refills | Status: AC | PRN

## 2023-10-04 MED ORDER — NURTEC 75 MG PO TBDP: ORAL_TABLET | ORAL | 11 refills | Status: AC

## 2023-10-04 MED ORDER — QULIPTA 60 MG PO TABS: 1.0000 | ORAL_TABLET | Freq: Every day | ORAL | 3 refills | Status: DC

## 2023-10-05 ENCOUNTER — Other Ambulatory Visit: Payer: Self-pay | Admitting: Neurology

## 2023-10-05 DIAGNOSIS — G43711 Chronic migraine without aura, intractable, with status migrainosus: Secondary | ICD-10-CM

## 2023-10-07 ENCOUNTER — Ambulatory Visit (INDEPENDENT_AMBULATORY_CARE_PROVIDER_SITE_OTHER)

## 2023-10-07 DIAGNOSIS — J309 Allergic rhinitis, unspecified: Secondary | ICD-10-CM

## 2023-10-12 ENCOUNTER — Other Ambulatory Visit (HOSPITAL_COMMUNITY): Payer: Self-pay

## 2023-10-13 ENCOUNTER — Ambulatory Visit

## 2023-10-13 DIAGNOSIS — J309 Allergic rhinitis, unspecified: Secondary | ICD-10-CM

## 2023-10-15 ENCOUNTER — Ambulatory Visit

## 2023-10-15 DIAGNOSIS — J4550 Severe persistent asthma, uncomplicated: Secondary | ICD-10-CM

## 2023-10-19 ENCOUNTER — Ambulatory Visit

## 2023-10-19 DIAGNOSIS — J309 Allergic rhinitis, unspecified: Secondary | ICD-10-CM

## 2023-10-27 ENCOUNTER — Ambulatory Visit (INDEPENDENT_AMBULATORY_CARE_PROVIDER_SITE_OTHER)

## 2023-10-27 DIAGNOSIS — J309 Allergic rhinitis, unspecified: Secondary | ICD-10-CM | POA: Diagnosis not present

## 2023-11-03 ENCOUNTER — Ambulatory Visit

## 2023-11-03 DIAGNOSIS — J309 Allergic rhinitis, unspecified: Secondary | ICD-10-CM | POA: Diagnosis not present

## 2023-11-09 ENCOUNTER — Other Ambulatory Visit: Payer: Self-pay | Admitting: Family Medicine

## 2023-11-09 ENCOUNTER — Other Ambulatory Visit: Payer: Self-pay | Admitting: *Deleted

## 2023-11-09 ENCOUNTER — Other Ambulatory Visit: Payer: Self-pay | Admitting: Allergy and Immunology

## 2023-11-09 DIAGNOSIS — J4550 Severe persistent asthma, uncomplicated: Secondary | ICD-10-CM

## 2023-11-09 MED ORDER — QULIPTA 60 MG PO TABS: 1.0000 | ORAL_TABLET | Freq: Every day | ORAL | 3 refills | Status: AC

## 2023-11-09 NOTE — Telephone Encounter (Signed)
 Last seen on 10/04/23 Follow up scheduled on 10/03/24

## 2023-11-10 ENCOUNTER — Ambulatory Visit
Admission: RE | Admit: 2023-11-10 | Discharge: 2023-11-10 | Disposition: A | Discharge status: Home / Self Care | Source: Ambulatory Visit | Attending: Internal Medicine | Admitting: Internal Medicine

## 2023-11-10 DIAGNOSIS — N6320 Unspecified lump in the left breast, unspecified quadrant: Secondary | ICD-10-CM | POA: Diagnosis not present

## 2023-11-10 DIAGNOSIS — N63 Unspecified lump in unspecified breast: Secondary | ICD-10-CM

## 2023-11-10 MED ORDER — GADOPICLENOL 0.5 MMOL/ML IV SOLN
8.0000 mL | Freq: Once | INTRAVENOUS | Status: AC | PRN
  Administered 2023-11-10: 8 mL via INTRAVENOUS

## 2023-11-12 ENCOUNTER — Ambulatory Visit (INDEPENDENT_AMBULATORY_CARE_PROVIDER_SITE_OTHER): Admitting: *Deleted

## 2023-11-12 DIAGNOSIS — J4550 Severe persistent asthma, uncomplicated: Secondary | ICD-10-CM

## 2023-11-16 ENCOUNTER — Encounter: Payer: Self-pay | Admitting: Allergy and Immunology

## 2023-11-16 ENCOUNTER — Ambulatory Visit (INDEPENDENT_AMBULATORY_CARE_PROVIDER_SITE_OTHER): Admitting: Allergy and Immunology

## 2023-11-16 ENCOUNTER — Other Ambulatory Visit: Payer: Self-pay

## 2023-11-16 VITALS — BP 120/72 | HR 71 | Temp 98.2°F | Resp 18 | Ht 66.54 in | Wt 183.6 lb

## 2023-11-16 DIAGNOSIS — J3089 Other allergic rhinitis: Secondary | ICD-10-CM

## 2023-11-16 DIAGNOSIS — G43909 Migraine, unspecified, not intractable, without status migrainosus: Secondary | ICD-10-CM

## 2023-11-16 DIAGNOSIS — J309 Allergic rhinitis, unspecified: Secondary | ICD-10-CM

## 2023-11-16 DIAGNOSIS — K219 Gastro-esophageal reflux disease without esophagitis: Secondary | ICD-10-CM

## 2023-11-16 DIAGNOSIS — G4720 Circadian rhythm sleep disorder, unspecified type: Secondary | ICD-10-CM | POA: Diagnosis not present

## 2023-11-16 DIAGNOSIS — J4550 Severe persistent asthma, uncomplicated: Secondary | ICD-10-CM | POA: Diagnosis not present

## 2023-11-16 MED ORDER — AIRSUPRA 90-80 MCG/ACT IN AERO: 2.0000 | INHALATION_SPRAY | RESPIRATORY_TRACT | 1 refills | Status: AC | PRN

## 2023-11-16 MED ORDER — FAMOTIDINE 20 MG PO TABS: 20.0000 mg | ORAL_TABLET | Freq: Two times a day (BID) | ORAL | 1 refills | Status: AC

## 2023-11-16 MED ORDER — FLUTICASONE PROPIONATE 50 MCG/ACT NA SUSP: 1.0000 | Freq: Two times a day (BID) | NASAL | 1 refills | Status: AC

## 2023-11-16 MED ORDER — ALBUTEROL SULFATE (2.5 MG/3ML) 0.083% IN NEBU: 2.5000 mg | INHALATION_SOLUTION | Freq: Four times a day (QID) | RESPIRATORY_TRACT | 1 refills | Status: AC | PRN

## 2023-11-16 MED ORDER — FEXOFENADINE HCL 30 MG PO TBDP
180.0000 mg | ORAL_TABLET | Freq: Every day | ORAL | 1 refills | Status: AC | PRN
Start: 2023-11-16 — End: ?

## 2023-11-16 MED ORDER — PANTOPRAZOLE SODIUM 40 MG PO TBEC: 40.0000 mg | DELAYED_RELEASE_TABLET | Freq: Two times a day (BID) | ORAL | 1 refills | Status: AC

## 2023-11-16 MED ORDER — CYPROHEPTADINE HCL 4 MG PO TABS: 4.0000 mg | ORAL_TABLET | Freq: Every day | ORAL | 5 refills | Status: AC

## 2023-11-16 MED ORDER — BUDESONIDE 0.5 MG/2ML IN SUSP: 0.5000 mg | Freq: Four times a day (QID) | RESPIRATORY_TRACT | 2 refills | Status: AC | PRN

## 2023-11-16 NOTE — Progress Notes (Unsigned)
 [place] - [place] - [place] - [place] - [place]   Follow-up Note  Referring Provider: Shayne Anes, MD Primary Provider: Shayne Anes, MD Date of Office Visit: 11/16/2023  Subjective:   Meredith Blackwell (DOB: 1965/11/03) is a 58 y.o. female who [facility] in re-evaluation of the following:  HPI: Canna returns to this clinic in evaluation of asthma, allergic rhinitis, reflux, history of headache, history of sleep dysfunction.  I last saw her in this clinic 17 August 2023.  She is doing much better at this point in time.  She has no significant respiratory tract issues.  This is the best her breathing has been in a long period in time.  She does not use a short acting bronchodilator and she can exert herself without any problem.  She has not been having any issues with smelling or tasting. We have started her on tezepelumab  injections and immunotherapy for her atopic respiratory tract disease.  She has discontinued her nebulized budesonide .  She still continues on a nasal steroid.  She continues on Periactin  which is helping her sleep.  She is now using Botox  for her headaches and has had significant improvement.  Her reflux is under pretty good control as is her throat issue on her current plan of therapy.  Allergies as of 11/16/2023       Reactions   Doxycycline    Lansoprazole    Other Reaction(s): bowels messed up; constipation; abdominal pain   Septra [sulfamethoxazole-trimethoprim]    Sulfamethoxazole    Trimethoprim         Medication List    Airsupra  90-80 MCG/ACT Aero Generic drug: Albuterol -Budesonide  Inhale 2 Inhalations into the lungs every 4 (four) hours as needed.   albuterol  (2.5 MG/3ML) 0.083% nebulizer solution Commonly known as: PROVENTIL  Take 3 mLs (2.5 mg total) by nebulization every 6 (six) hours as needed for wheezing or shortness of breath.   ascorbic acid 500 MG tablet Commonly known as: VITAMIN  C Take 500 mg by mouth daily.   Botox  200 units injection Generic drug: botulinum toxin Type A  Provider to inject 155 units into the muscles of the head and neck every 3 months. Discard remainder   budesonide  0.5 MG/2ML nebulizer solution Commonly known as: PULMICORT  Take 2 mLs (0.5 mg total) by nebulization every 6 (six) hours as needed.   Cholecalciferol 50 MCG (2000 UT) Caps Take 2,000 Units by mouth daily.   cyproheptadine  4 MG tablet Commonly known as: PERIACTIN  Take 1 tablet (4 mg total) by mouth at bedtime.   Dexcom G7 Sensor Misc 1 each by Does not apply route once a week. Every ten days.   EPINEPHrine  0.3 mg/0.3 mL Soaj injection Commonly known as: EPI-PEN Inject 0.3 mg into the muscle as needed for anaphylaxis.   famotidine  20 MG tablet Commonly known as: Pepcid  Take 1 tablet (20 mg total) by mouth 2 (two) times daily. Take one at 6pm and one before going to bed.   fexofenadine  30 MG disintegrating tablet Commonly known as: ALLEGRA  ODT Take 6 tablets (180 mg total) by mouth daily as needed (Can take an extra dose during flare ups.).   fluticasone  50 MCG/ACT nasal spray Commonly known as: FLONASE  Place 1 spray into both nostrils in the morning and at bedtime.   gabapentin  300 MG capsule Commonly known as: NEURONTIN  Take 1 capsule (300 mg total) by mouth 3 (three) times daily.   ibuprofen 200 MG tablet Commonly known as:  ADVIL Take 200 mg by mouth as needed.   indomethacin  25 MG capsule Commonly known as: INDOCIN  Take 1 capsule (25 mg total) by mouth 3 (three) times daily as needed.   melatonin 3 MG Tabs tablet Take by mouth.   Nebulizer Mask Adult Misc 1 kit by Does not apply route as directed.   Nurtec 75 MG Tbdp Generic drug: Rimegepant Sulfate TAKE 1 TABLET (75 MG) BY MOUTH DAILY AS NEEDED FOR MIGRAINES. TAKE AS CLOSE TO ONSET OF MIGRAINE AS POSSIBLE. ONE DAILY MAXIMUM.   ondansetron  4 MG disintegrating tablet Commonly known as:  ZOFRAN -ODT DISSOLVE 1 TO 2 TABLETS ON THE TONGUE EVERY 8 HOURS AS NEEDED FOR NAUSEA. MAY ALSO TAKE WITH MAXALT  FOR MIGRAINE   pantoprazole  40 MG tablet Commonly known as: PROTONIX  Take 1 tablet (40 mg total) by mouth 2 (two) times daily.   promethazine-dextromethorphan 6.25-15 MG/5ML syrup Commonly known as: PROMETHAZINE-DM Take by mouth.   Qulipta  60 MG Tabs Generic drug: Atogepant  Take 1 tablet (60 mg total) by mouth daily.   raloxifene 60 MG tablet Commonly known as: EVISTA raloxifene 60 mg tablet  TAKE 1 TABLET BY MOUTH ONCE DAILY   rizatriptan  10 MG disintegrating tablet Commonly known as: MAXALT -MLT Take 1 tablet (10 mg total) by mouth as needed for migraine. May repeat in 2 hours if needed     Past Medical History:  Diagnosis Date   Abdominal pain    Anal spasm    Anemia    Asthma    BRCA gene mutation negative    Constipation    Diarrhea    Dysphagia    Environmental and seasonal allergies    Family history of bladder cancer    Family history of breast cancer    Family history of melanoma    Family history of ovarian cancer    GERD (gastroesophageal reflux disease)    Glaucoma    Hemorrhoid    Nausea     Past Surgical History:  Procedure Laterality Date   ABDOMINAL HYSTERECTOMY     pt says hysterectomy was vaginal   APPENDECTOMY     BACK SURGERY     BREAST EXCISIONAL BIOPSY     BREAST LUMPECTOMY     NOSE SURGERY     SHOULDER SURGERY     TONSILLECTOMY     WISDOM TOOTH EXTRACTION      Review of systems negative except as noted in HPI / PMHx or noted below:  Review of Systems  Constitutional: Negative.   HENT: Negative.    Eyes: Negative.   Respiratory: Negative.    Cardiovascular: Negative.   Gastrointestinal: Negative.   Genitourinary: Negative.   Musculoskeletal: Negative.   Skin: Negative.   Neurological: Negative.   Endo/Heme/Allergies: Negative.   Psychiatric/Behavioral: Negative.       Objective:   Vitals:   11/16/23 0922   BP: 120/72  Pulse: 71  Resp: 18  Temp: 98.2 F (36.8 C)  SpO2: 97%   Height: 5' 6.54 (169 cm)  Weight: 183 lb 9.6 oz (83.3 kg)   Physical Exam Constitutional:      Appearance: She is not diaphoretic.  HENT:     Head: Normocephalic.     Right Ear: Tympanic membrane, ear canal and external ear normal.     Left Ear: Tympanic membrane, ear canal and external ear normal.     Nose: Nose normal. No mucosal edema or rhinorrhea.     Mouth/Throat:     Pharynx: Uvula midline. No oropharyngeal  exudate.  Eyes:     Conjunctiva/sclera: Conjunctivae normal.  Neck:     Thyroid: No thyromegaly.     Trachea: Trachea normal. No tracheal tenderness or tracheal deviation.  Cardiovascular:     Rate and Rhythm: Normal rate and regular rhythm.     Heart sounds: Normal heart sounds, S1 normal and S2 normal. No murmur heard. Pulmonary:     Effort: No respiratory distress.     Breath sounds: Normal breath sounds. No stridor. No wheezing or rales.  Lymphadenopathy:     Head:     Right side of head: No tonsillar adenopathy.     Left side of head: No tonsillar adenopathy.     Cervical: No cervical adenopathy.  Skin:    Findings: No erythema or rash.     Nails: There is no clubbing.  Neurological:     Mental Status: She is alert.     Diagnostics: Spirometry was performed and demonstrated an FEV1 of 2.46 at 93 % of predicted.  Her previous FEV1 was 1.88  Results of blood tests obtained 17 August 2023 identifies IgE antibodies directed against cat, dog, cockroach, cedar trees, total IgE 22 KU/L  Assessment and Plan:   1. Severe persistent asthma without complication (HCC)   2. Allergic rhinitis, unspecified seasonality, unspecified trigger   3. LPRD (laryngopharyngeal reflux disease)   4. Dysfunction of sleep stage or arousal   5. Migraine syndrome    1. Treat and prevent inflammation of airway:   A. Flonase  - 1 spray each nostril 2 times per day  B. Nebulized budesonide  - 2 times per day     C. Tezepelumab  injections every 4 weeks  D. Immunotherapy   2. Treat and prevent reflux induced respiratory inflammation:   A. Minimize caffeine consumption  B. Replace throat clearing with swallowing/drinking maneuver  C. Continue pantoprazole  40 mg - 2 times per day  D. Continue famotidine  20 mg - 1 tablet @ 6pm and before bed  E. Nissen fundoplication???  3. Treat and prevent headache / sleep dysfunction:   A. Minimize caffeine consumption  B. Periactin  4 mg - 1/2-1 tablet at bedtime  C. Continue with botox  injections  4. If needed:   A. AirSupra  - 2 inhalations every 4-6 hours  B. Albuterol  added to budesonide  neb 2 times per day  B. Allegra  180 daily    5. Taper off gabapentin . Remove 1 pill every week. Mid-day, evening, morning  6. Influenza = tamiflu. Cobid = paxlovid   7. Return to clinic in 6 months or earlier if problem  Fajr is doing much better at this point and she is going to remain on a combination of anti-inflammatory agents including use of anti-TSLP antibody and immunotherapy to address her atopic respiratory disease.  She has already tapered off her nebulized budesonide  and she can find a dose of Flonase  that works for her.  And she can continue to treat her LPR as noted above.  She has very bad reflux and I have given her the name of a Nissen fundoplication to explore.  She can begin to taper off some of her medicines including her gabapentin  which was given to her for cough suppression.  Assuming she does well I will see her back in this clinic in 6 months or earlier if there is a problem.  Camellia Denis, MD Allergy / Immunology [facility]

## 2023-11-16 NOTE — Patient Instructions (Addendum)
  1. Treat and prevent inflammation of airway:   A. Flonase  - 1 spray each nostril 2 times per day  B. Nebulized budesonide  - 2 times per day    C. Tezepelumab  injections every 4 weeks  D. Immunotherapy   2. Treat and prevent reflux induced respiratory inflammation:   A. Minimize caffeine consumption  B. Replace throat clearing with swallowing/drinking maneuver  C. Continue pantoprazole  40 mg - 2 times per day  D. Continue famotidine  20 mg - 1 tablet @ 6pm and before bed  E. Nissen fundoplication???  3. Treat and prevent headache / sleep dysfunction:   A. Minimize caffeine consumption  B. Periactin  4 mg - 1/2-1 tablet at bedtime  C. Continue with botox  injections  4. If needed:   A. AirSupra  - 2 inhalations every 4-6 hours  B. Albuterol  added to budesonide  neb 2 times per day  B. Allegra  180 daily    5. Taper off gabapentin . Remove 1 pill every week. Mid-day, evening, morning  6. Influenza = tamiflu. Cobid = paxlovid   7. Return to clinic in 6 months or earlier if problem

## 2023-11-17 ENCOUNTER — Encounter: Payer: Self-pay | Admitting: Allergy and Immunology

## 2023-11-18 ENCOUNTER — Ambulatory Visit (INDEPENDENT_AMBULATORY_CARE_PROVIDER_SITE_OTHER)

## 2023-11-18 DIAGNOSIS — J309 Allergic rhinitis, unspecified: Secondary | ICD-10-CM | POA: Diagnosis not present

## 2023-11-22 DIAGNOSIS — L299 Pruritus, unspecified: Secondary | ICD-10-CM | POA: Diagnosis not present

## 2023-11-22 DIAGNOSIS — S50861A Insect bite (nonvenomous) of right forearm, initial encounter: Secondary | ICD-10-CM | POA: Diagnosis not present

## 2023-11-22 NOTE — Addendum Note (Signed)
 Addended by: DANIEL NIVIA DEL on: 11/22/2023 04:59 PM   Modules accepted: Orders

## 2023-11-26 ENCOUNTER — Ambulatory Visit (INDEPENDENT_AMBULATORY_CARE_PROVIDER_SITE_OTHER)

## 2023-11-26 DIAGNOSIS — J309 Allergic rhinitis, unspecified: Secondary | ICD-10-CM | POA: Diagnosis not present

## 2023-11-29 ENCOUNTER — Ambulatory Visit (INDEPENDENT_AMBULATORY_CARE_PROVIDER_SITE_OTHER)

## 2023-11-29 DIAGNOSIS — J309 Allergic rhinitis, unspecified: Secondary | ICD-10-CM

## 2023-11-29 DIAGNOSIS — E559 Vitamin D deficiency, unspecified: Secondary | ICD-10-CM | POA: Diagnosis not present

## 2023-11-29 DIAGNOSIS — E785 Hyperlipidemia, unspecified: Secondary | ICD-10-CM | POA: Diagnosis not present

## 2023-11-29 DIAGNOSIS — R7301 Impaired fasting glucose: Secondary | ICD-10-CM | POA: Diagnosis not present

## 2023-11-29 DIAGNOSIS — D509 Iron deficiency anemia, unspecified: Secondary | ICD-10-CM | POA: Diagnosis not present

## 2023-11-30 ENCOUNTER — Other Ambulatory Visit: Payer: Self-pay

## 2023-12-01 ENCOUNTER — Other Ambulatory Visit: Payer: Self-pay | Admitting: Internal Medicine

## 2023-12-01 DIAGNOSIS — M5416 Radiculopathy, lumbar region: Secondary | ICD-10-CM

## 2023-12-01 DIAGNOSIS — N951 Menopausal and female climacteric states: Secondary | ICD-10-CM

## 2023-12-01 NOTE — Telephone Encounter (Signed)
 Pt/pharm requesting gabapentin   Please advise

## 2023-12-03 ENCOUNTER — Ambulatory Visit (INDEPENDENT_AMBULATORY_CARE_PROVIDER_SITE_OTHER)

## 2023-12-03 DIAGNOSIS — J309 Allergic rhinitis, unspecified: Secondary | ICD-10-CM | POA: Diagnosis not present

## 2023-12-06 ENCOUNTER — Ambulatory Visit (INDEPENDENT_AMBULATORY_CARE_PROVIDER_SITE_OTHER)

## 2023-12-06 DIAGNOSIS — J309 Allergic rhinitis, unspecified: Secondary | ICD-10-CM | POA: Diagnosis not present

## 2023-12-06 DIAGNOSIS — Z1339 Encounter for screening examination for other mental health and behavioral disorders: Secondary | ICD-10-CM | POA: Diagnosis not present

## 2023-12-06 DIAGNOSIS — R82998 Other abnormal findings in urine: Secondary | ICD-10-CM | POA: Diagnosis not present

## 2023-12-06 DIAGNOSIS — N6019 Diffuse cystic mastopathy of unspecified breast: Secondary | ICD-10-CM | POA: Diagnosis not present

## 2023-12-06 DIAGNOSIS — Z0000 Encounter for general adult medical examination without abnormal findings: Secondary | ICD-10-CM | POA: Diagnosis not present

## 2023-12-06 DIAGNOSIS — Z1331 Encounter for screening for depression: Secondary | ICD-10-CM | POA: Diagnosis not present

## 2023-12-10 ENCOUNTER — Other Ambulatory Visit (HOSPITAL_COMMUNITY): Payer: Self-pay

## 2023-12-10 ENCOUNTER — Telehealth (HOSPITAL_COMMUNITY): Payer: Self-pay

## 2023-12-13 ENCOUNTER — Ambulatory Visit

## 2023-12-13 DIAGNOSIS — J4550 Severe persistent asthma, uncomplicated: Secondary | ICD-10-CM | POA: Diagnosis not present

## 2023-12-16 ENCOUNTER — Other Ambulatory Visit: Payer: Self-pay

## 2023-12-16 ENCOUNTER — Ambulatory Visit

## 2023-12-16 DIAGNOSIS — J309 Allergic rhinitis, unspecified: Secondary | ICD-10-CM | POA: Diagnosis not present

## 2023-12-17 DIAGNOSIS — M25561 Pain in right knee: Secondary | ICD-10-CM | POA: Diagnosis not present

## 2023-12-20 ENCOUNTER — Other Ambulatory Visit: Payer: Self-pay

## 2023-12-20 NOTE — Progress Notes (Signed)
 Specialty Pharmacy Refill Coordination Note  Meredith Blackwell is a 59 y.o. female assessed today regarding refills of clinic administered specialty medication(s) OnabotulinumtoxinA  (BOTOX )   Clinic requested Courier to Provider Office   Delivery date: 12/22/23   Verified address: [facility], [address], [place], [place] [address]   Medication will be filled on: 12/21/23   Copay: $0.00 Appointment: 12.15.25

## 2023-12-21 ENCOUNTER — Other Ambulatory Visit: Payer: Self-pay

## 2023-12-22 ENCOUNTER — Ambulatory Visit (INDEPENDENT_AMBULATORY_CARE_PROVIDER_SITE_OTHER)

## 2023-12-22 DIAGNOSIS — J309 Allergic rhinitis, unspecified: Secondary | ICD-10-CM | POA: Diagnosis not present

## 2023-12-23 DIAGNOSIS — M5416 Radiculopathy, lumbar region: Secondary | ICD-10-CM | POA: Diagnosis not present

## 2023-12-23 DIAGNOSIS — Z6829 Body mass index (BMI) 29.0-29.9, adult: Secondary | ICD-10-CM | POA: Diagnosis not present

## 2023-12-27 ENCOUNTER — Ambulatory Visit: Admitting: Family Medicine

## 2023-12-27 VITALS — BP 138/80

## 2023-12-27 DIAGNOSIS — G43711 Chronic migraine without aura, intractable, with status migrainosus: Secondary | ICD-10-CM | POA: Diagnosis not present

## 2023-12-27 MED ORDER — ONABOTULINUMTOXINA 200 UNITS IJ SOLR
155.0000 [IU] | Freq: Once | INTRAMUSCULAR | Status: AC
  Administered 2023-12-27: 16:00:00 155 [IU] via INTRAMUSCULAR

## 2023-12-27 NOTE — Progress Notes (Signed)
 12/27/2023 ALL: Meredith Blackwell returns for Botox . First procedure 09/2023. Baseline 15 migraines per month. She has noted improvement but unclear how much. She stopped Qulipta  due to difficulty getting refill and constipation. She tolerated Botox  well.   09/23/2023 ALL: Meredith Blackwell presents to start Botox . Now having at least 15 headache days with most all being described as migrainous. She continues Qulipta  and rizatriptan .     Consent Form Botulism Toxin Injection For Chronic Migraine    Reviewed orally with patient, additionally signature is on file:  Botulism toxin has been approved by the Federal drug administration for treatment of chronic migraine. Botulism toxin does not cure chronic migraine and it may not be effective in some patients.  The administration of botulism toxin is accomplished by injecting a small amount of toxin into the muscles of the neck and head. Dosage must be titrated for each individual. Any benefits resulting from botulism toxin tend to wear off after 3 months with a repeat injection required if benefit is to be maintained. Injections are usually done every 3-4 months with maximum effect peak achieved by about 2 or 3 weeks. Botulism toxin is expensive and you should be sure of what costs you will incur resulting from the injection.  The side effects of botulism toxin use for chronic migraine may include:   -Transient, and usually mild, facial weakness with facial injections  -Transient, and usually mild, head or neck weakness with head/neck injections  -Reduction or loss of forehead facial animation due to forehead muscle weakness  -Eyelid drooping  -Dry eye  -Pain at the site of injection or bruising at the site of injection  -Double vision  -Potential unknown long term risks   Contraindications: You should not have Botox  if you are pregnant, nursing, allergic to albumin, have an infection, skin condition, or muscle weakness at the site of the injection, or have  myasthenia gravis, Lambert-Eaton syndrome, or ALS.  It is also possible that as with any injection, there may be an allergic reaction or no effect from the medication. Reduced effectiveness after repeated injections is sometimes seen and rarely infection at the injection site may occur. All care will be taken to prevent these side effects. If therapy is given over a long time, atrophy and wasting in the muscle injected may occur. Occasionally the patient's become refractory to treatment because they develop antibodies to the toxin. In this event, therapy needs to be modified.  I have read the above information and consent to the administration of botulism toxin.    BOTOX  PROCEDURE NOTE FOR MIGRAINE HEADACHE  Contraindications and precautions discussed with patient(above). Aseptic procedure was observed and patient tolerated procedure. Procedure performed by Greig Forbes, FNP-C.   The condition has existed for more than 6 months, and pt does not have a diagnosis of ALS, Myasthenia Gravis or Lambert-Eaton Syndrome.  Risks and benefits of injections discussed and pt agrees to proceed with the procedure.  Written consent obtained  These injections are medically necessary. Pt  receives good benefits from these injections. These injections do not cause sedations or hallucinations which the oral therapies may cause.   Description of procedure:  The patient was placed in a sitting position. The standard protocol was used for Botox  as follows, with 5 units of Botox  injected at each site:  -Procerus muscle, midline injection  -Corrugator muscle, bilateral injection  -Frontalis muscle, bilateral injection, with 2 sites each side, medial injection was performed in the upper one third of the frontalis muscle,  in the region vertical from the medial inferior edge of the superior orbital rim. The lateral injection was again in the upper one third of the forehead vertically above the lateral limbus of the  cornea, 1.5 cm lateral to the medial injection site.  -Temporalis muscle injection, 4 sites, bilaterally. The first injection was 3 cm above the tragus of the ear, second injection site was 1.5 cm to 3 cm up from the first injection site in line with the tragus of the ear. The third injection site was 1.5-3 cm forward between the first 2 injection sites. The fourth injection site was 1.5 cm posterior to the second injection site. 5th site laterally in the temporalis  muscleat the level of the outer canthus.  -Occipitalis muscle injection, 3 sites, bilaterally. The first injection was done one half way between the occipital protuberance and the tip of the mastoid process behind the ear. The second injection site was done lateral and superior to the first, 1 fingerbreadth from the first injection. The third injection site was 1 fingerbreadth superiorly and medially from the first injection site.  -Cervical paraspinal muscle injection, 2 sites, bilaterally. The first injection site was 1 cm from the midline of the cervical spine, 3 cm inferior to the lower border of the occipital protuberance. The second injection site was 1.5 cm superiorly and laterally to the first injection site.  -Trapezius muscle injection was performed at 3 sites, bilaterally. The first injection site was in the upper trapezius muscle halfway between the inflection point of the neck, and the acromion. The second injection site was one half way between the acromion and the first injection site. The third injection was done between the first injection site and the inflection point of the neck.   Will return for repeat injection in 3 months.   A total of 200 units of Botox  was prepared, 155 units of Botox  was injected as documented above, any Botox  not injected was wasted. The patient tolerated the procedure well, there were no complications of the above procedure.

## 2023-12-27 NOTE — Progress Notes (Signed)
 Botox - 200 units x 1 vial Lot: [id number] Expiration: 2028/03 NDC: 0023-3921-02  Bacteriostatic 0.9% Sodium Chloride - 4 mL  Lot: [id number] Expiration: 11/11/24 NDC: 9590803397  Dx: G43.009, G43.711 S/P  Witnessed by JINNY ROYS

## 2023-12-29 ENCOUNTER — Ambulatory Visit (INDEPENDENT_AMBULATORY_CARE_PROVIDER_SITE_OTHER)

## 2023-12-29 DIAGNOSIS — J309 Allergic rhinitis, unspecified: Secondary | ICD-10-CM

## 2023-12-30 DIAGNOSIS — M1711 Unilateral primary osteoarthritis, right knee: Secondary | ICD-10-CM | POA: Diagnosis not present

## 2023-12-31 ENCOUNTER — Telehealth: Payer: Self-pay | Admitting: Pharmacist

## 2023-12-31 NOTE — Telephone Encounter (Signed)
 Pharmacy Patient Advocate Encounter   Received notification from CoverMyMeds that prior authorization for Qulipta  60MG  tablets is due for renewal.   Insurance verification completed.   The patient is insured through [facility].  Action: Medication has been discontinued. Archived Key: [id number]

## 2024-01-02 DIAGNOSIS — M25561 Pain in right knee: Secondary | ICD-10-CM | POA: Diagnosis not present

## 2024-01-04 ENCOUNTER — Ambulatory Visit

## 2024-01-04 DIAGNOSIS — J309 Allergic rhinitis, unspecified: Secondary | ICD-10-CM

## 2024-01-05 DIAGNOSIS — M1711 Unilateral primary osteoarthritis, right knee: Secondary | ICD-10-CM | POA: Diagnosis not present

## 2024-01-10 ENCOUNTER — Ambulatory Visit

## 2024-01-10 DIAGNOSIS — J4550 Severe persistent asthma, uncomplicated: Secondary | ICD-10-CM

## 2024-01-10 DIAGNOSIS — J309 Allergic rhinitis, unspecified: Secondary | ICD-10-CM | POA: Diagnosis not present

## 2024-01-10 DIAGNOSIS — M1711 Unilateral primary osteoarthritis, right knee: Secondary | ICD-10-CM | POA: Diagnosis not present

## 2024-01-19 ENCOUNTER — Ambulatory Visit

## 2024-01-19 DIAGNOSIS — J302 Other seasonal allergic rhinitis: Secondary | ICD-10-CM | POA: Diagnosis not present

## 2024-01-25 ENCOUNTER — Encounter: Payer: Self-pay | Admitting: Allergy and Immunology

## 2024-01-26 ENCOUNTER — Other Ambulatory Visit: Payer: Self-pay | Admitting: *Deleted

## 2024-01-26 ENCOUNTER — Other Ambulatory Visit (HOSPITAL_COMMUNITY): Payer: Self-pay

## 2024-01-26 ENCOUNTER — Ambulatory Visit (INDEPENDENT_AMBULATORY_CARE_PROVIDER_SITE_OTHER)

## 2024-01-26 DIAGNOSIS — J302 Other seasonal allergic rhinitis: Secondary | ICD-10-CM

## 2024-01-26 MED ORDER — TEZSPIRE 210 MG/1.91ML ~~LOC~~ SOAJ: 210.0000 mg | SUBCUTANEOUS | 11 refills | Status: AC

## 2024-02-02 ENCOUNTER — Ambulatory Visit

## 2024-02-02 DIAGNOSIS — J302 Other seasonal allergic rhinitis: Secondary | ICD-10-CM

## 2024-02-07 ENCOUNTER — Ambulatory Visit

## 2024-02-10 ENCOUNTER — Ambulatory Visit: Admitting: *Deleted

## 2024-02-10 ENCOUNTER — Encounter: Payer: Self-pay | Admitting: Family Medicine

## 2024-02-10 DIAGNOSIS — J302 Other seasonal allergic rhinitis: Secondary | ICD-10-CM

## 2024-02-10 DIAGNOSIS — J4550 Severe persistent asthma, uncomplicated: Secondary | ICD-10-CM

## 2024-02-17 ENCOUNTER — Ambulatory Visit

## 2024-02-17 ENCOUNTER — Telehealth: Payer: Self-pay | Admitting: Family Medicine

## 2024-02-17 DIAGNOSIS — J302 Other seasonal allergic rhinitis: Secondary | ICD-10-CM

## 2024-02-17 NOTE — Telephone Encounter (Signed)
 Submitted PA renewal request via CMM, status is pending. Key: [id number]

## 2024-03-09 ENCOUNTER — Ambulatory Visit

## 2024-03-21 ENCOUNTER — Ambulatory Visit: Admitting: Family Medicine

## 2024-05-16 ENCOUNTER — Ambulatory Visit: Admitting: Allergy and Immunology

## 2024-10-03 ENCOUNTER — Ambulatory Visit: Admitting: Family Medicine
